# Patient Record
Sex: Female | Born: 1975 | Race: Black or African American | Hispanic: No | Marital: Married | Smoking: Never smoker
Health system: Southern US, Community
[De-identification: ages and names within clinical notes are randomized; demographics above are authoritative.]

## PROBLEM LIST (undated history)

## (undated) DIAGNOSIS — K449 Diaphragmatic hernia without obstruction or gangrene: Secondary | ICD-10-CM

## (undated) DIAGNOSIS — F419 Anxiety disorder, unspecified: Secondary | ICD-10-CM

## (undated) DIAGNOSIS — K469 Unspecified abdominal hernia without obstruction or gangrene: Secondary | ICD-10-CM

## (undated) DIAGNOSIS — Z8489 Family history of other specified conditions: Secondary | ICD-10-CM

## (undated) DIAGNOSIS — N3945 Continuous leakage: Secondary | ICD-10-CM

## (undated) DIAGNOSIS — I10 Essential (primary) hypertension: Secondary | ICD-10-CM

## (undated) DIAGNOSIS — K219 Gastro-esophageal reflux disease without esophagitis: Secondary | ICD-10-CM

## (undated) DIAGNOSIS — G43909 Migraine, unspecified, not intractable, without status migrainosus: Secondary | ICD-10-CM

## (undated) HISTORY — DX: Continuous leakage: N39.45

## (undated) HISTORY — DX: Anxiety disorder, unspecified: F41.9

## (undated) HISTORY — PX: TUBAL LIGATION: SHX77

## (undated) HISTORY — PX: WISDOM TOOTH EXTRACTION: SHX21

## (undated) HISTORY — DX: Diaphragmatic hernia without obstruction or gangrene: K44.9

## (undated) HISTORY — DX: Unspecified abdominal hernia without obstruction or gangrene: K46.9

## (undated) HISTORY — DX: Gastro-esophageal reflux disease without esophagitis: K21.9

## (undated) HISTORY — DX: Migraine, unspecified, not intractable, without status migrainosus: G43.909

---

## 2011-09-20 HISTORY — PX: COLONOSCOPY: SHX174

## 2012-04-11 ENCOUNTER — Encounter (INDEPENDENT_AMBULATORY_CARE_PROVIDER_SITE_OTHER): Payer: Self-pay | Admitting: *Deleted

## 2012-04-24 ENCOUNTER — Telehealth (INDEPENDENT_AMBULATORY_CARE_PROVIDER_SITE_OTHER): Payer: Self-pay | Admitting: *Deleted

## 2012-04-24 ENCOUNTER — Ambulatory Visit (INDEPENDENT_AMBULATORY_CARE_PROVIDER_SITE_OTHER): Payer: BC Managed Care – PPO | Admitting: Internal Medicine

## 2012-04-24 ENCOUNTER — Encounter (INDEPENDENT_AMBULATORY_CARE_PROVIDER_SITE_OTHER): Payer: Self-pay | Admitting: Internal Medicine

## 2012-04-24 ENCOUNTER — Other Ambulatory Visit (INDEPENDENT_AMBULATORY_CARE_PROVIDER_SITE_OTHER): Payer: Self-pay | Admitting: *Deleted

## 2012-04-24 VITALS — BP 100/80 | HR 68 | Temp 98.1°F | Ht 59.0 in | Wt 194.9 lb

## 2012-04-24 DIAGNOSIS — R195 Other fecal abnormalities: Secondary | ICD-10-CM

## 2012-04-24 DIAGNOSIS — Z800 Family history of malignant neoplasm of digestive organs: Secondary | ICD-10-CM

## 2012-04-24 DIAGNOSIS — R198 Other specified symptoms and signs involving the digestive system and abdomen: Secondary | ICD-10-CM

## 2012-04-24 DIAGNOSIS — Z1211 Encounter for screening for malignant neoplasm of colon: Secondary | ICD-10-CM

## 2012-04-24 DIAGNOSIS — G43909 Migraine, unspecified, not intractable, without status migrainosus: Secondary | ICD-10-CM | POA: Insufficient documentation

## 2012-04-24 DIAGNOSIS — K219 Gastro-esophageal reflux disease without esophagitis: Secondary | ICD-10-CM

## 2012-04-24 DIAGNOSIS — R1310 Dysphagia, unspecified: Secondary | ICD-10-CM

## 2012-04-24 MED ORDER — HYOSCYAMINE SULFATE 0.125 MG SL SUBL: 0.1250 mg | SUBLINGUAL_TABLET | SUBLINGUAL | Status: DC | PRN

## 2012-04-24 NOTE — Patient Instructions (Addendum)
EGD/ED and colonoscopy with Dr Karilyn Cota. Levsin 0.125mg  # 60 with 2 refills. GERD diet.

## 2012-04-24 NOTE — Telephone Encounter (Signed)
Patient needs movi prep 

## 2012-04-24 NOTE — Progress Notes (Signed)
Subjective:     Patient ID: Sabrina Hunter, female   DOB: June 30, 1976, 36 y.o.   MRN: [id number]  HPI Referral from Dr. Loney Hering for dysphagia and family hx of diverticulitis.  She tells me she has had trouble swallowing for about 3 months. Foods ar lodging in her esophagus.  Breads and french fries will lodge in her esophagus.  Spaghetti sauce and ketchup burn her esophagus.Corn upset her stomach.   She will have epigastric pain daily. The Prilosec is not helping. She also tells me she has rectal pain. It hurts to sit down. She has to sit on one buttock to prevent the pain. She is also passing a clear mucous about once every other day. She tells me some of her stools are hard and some are soft. Stools are sometimes green and sometimes brown.  She says she can feel a lump in her rectum when she wipes. She is having 4 stools a day which is abnormal for her . She use to have 1-2 stools a day. No melena or bright red rectal bleeding. Appetite is good. No weight loss.    Grandfather had colon cancer and deceased in his 56s.  Review of Systems see hpi Current Outpatient Prescriptions  Medication Sig Dispense Refill  . meloxicam (MOBIC) 7.5 MG tablet Take 7.5 mg by mouth daily.      Marland Kitchen omeprazole (PRILOSEC) 20 MG capsule Take 20 mg by mouth daily.      Marland Kitchen topiramate (TOPAMAX) 25 MG tablet Take 25 mg by mouth 2 (two) times daily.       Past Medical History  Diagnosis Date  . Migraines    Past Surgical History  Procedure Date  . Tubal ligation     2007   Family Status  Relation Status Death Age  . Mother Alive     HTN, DM2, high cholesterol  . Father Alive     HTN, diverticulitis, stomach problems.  . Sister Alive     Asthma, HTN   History   Social History  . Marital Status: Married    Spouse Name: N/A    Number of Children: N/A  . Years of Education: N/A   Occupational History  . Not on file.   Social History Main Topics  . Smoking status: Never Smoker   . Smokeless tobacco:  Not on file  . Alcohol Use: No  . Drug Use: No  . Sexually Active: Not on file   Other Topics Concern  . Not on file   Social History Narrative  . No narrative on file   No Known Allergies      Objective:   Physical Exam Filed Vitals:   04/24/12 0935  Height: 4\' 11"  (1.499 m)  Weight: 194 lb 14.4 oz (88.406 kg)    Alert and oriented. Skin warm and dry. Oral mucosa is moist.   . Sclera anicteric, conjunctivae is pink. Thyroid not enlarged. No cervical lymphadenopathy. Lungs clear. Heart regular rate and rhythm.  Abdomen is soft. Bowel sounds are positive. No hepatomegaly. No abdominal masses felt. No tenderness. Stool brown and guaiac negative.   No edema to lower extremities.      Assessment:    Dysphagia to solids. GERD not controlled with Prilosec. Change in bowel habits.      Plan:    EGD/ED, colonoscopy. GERD diet given to patient. Levsin 0.125mg  #60 with 2 refills. Samples of Aciphex 5 boxes given to patient

## 2012-04-25 MED ORDER — PEG-KCL-NACL-NASULF-NA ASC-C 100 G PO SOLR: 1.0000 | Freq: Once | ORAL | Status: DC

## 2012-05-16 ENCOUNTER — Encounter (INDEPENDENT_AMBULATORY_CARE_PROVIDER_SITE_OTHER): Payer: Self-pay

## 2012-05-16 MED ORDER — SODIUM CHLORIDE 0.45 % IV SOLN
Freq: Once | INTRAVENOUS | Status: AC
  Administered 2012-05-17: 1000 mL via INTRAVENOUS

## 2012-05-17 ENCOUNTER — Encounter (HOSPITAL_COMMUNITY)
Admission: RE | Disposition: A | Discharge status: Home / Self Care | Payer: Self-pay | Source: Ambulatory Visit | Attending: Internal Medicine

## 2012-05-17 ENCOUNTER — Ambulatory Visit (HOSPITAL_COMMUNITY)
Admission: RE | Admit: 2012-05-17 | Discharge: 2012-05-17 | Disposition: A | Discharge status: Home / Self Care | Payer: BC Managed Care – PPO | Source: Ambulatory Visit | Attending: Internal Medicine | Admitting: Internal Medicine

## 2012-05-17 ENCOUNTER — Encounter (HOSPITAL_COMMUNITY): Payer: Self-pay | Admitting: *Deleted

## 2012-05-17 DIAGNOSIS — R198 Other specified symptoms and signs involving the digestive system and abdomen: Secondary | ICD-10-CM | POA: Insufficient documentation

## 2012-05-17 DIAGNOSIS — Z8371 Family history of colonic polyps: Secondary | ICD-10-CM | POA: Insufficient documentation

## 2012-05-17 DIAGNOSIS — R195 Other fecal abnormalities: Secondary | ICD-10-CM

## 2012-05-17 DIAGNOSIS — K219 Gastro-esophageal reflux disease without esophagitis: Secondary | ICD-10-CM

## 2012-05-17 DIAGNOSIS — K644 Residual hemorrhoidal skin tags: Secondary | ICD-10-CM

## 2012-05-17 DIAGNOSIS — R1310 Dysphagia, unspecified: Secondary | ICD-10-CM

## 2012-05-17 DIAGNOSIS — Z800 Family history of malignant neoplasm of digestive organs: Secondary | ICD-10-CM

## 2012-05-17 DIAGNOSIS — K449 Diaphragmatic hernia without obstruction or gangrene: Secondary | ICD-10-CM

## 2012-05-17 DIAGNOSIS — Z83719 Family history of colon polyps, unspecified: Secondary | ICD-10-CM | POA: Insufficient documentation

## 2012-05-17 HISTORY — PX: BALLOON DILATION: SHX5330

## 2012-05-17 HISTORY — PX: MALONEY DILATION: SHX5535

## 2012-05-17 HISTORY — PX: SAVORY DILATION: SHX5439

## 2012-05-17 SURGERY — COLONOSCOPY WITH ESOPHAGOGASTRODUODENOSCOPY (EGD)
Anesthesia: Moderate Sedation

## 2012-05-17 MED ORDER — MEPERIDINE HCL 50 MG/ML IJ SOLN
INTRAMUSCULAR | Status: AC
  Filled 2012-05-17: qty 1

## 2012-05-17 MED ORDER — MEPERIDINE HCL 50 MG/ML IJ SOLN
INTRAMUSCULAR | Status: DC | PRN
  Administered 2012-05-17 (×2): 25 mg via INTRAVENOUS

## 2012-05-17 MED ORDER — MIDAZOLAM HCL 5 MG/5ML IJ SOLN
INTRAMUSCULAR | Status: AC
  Filled 2012-05-17: qty 10

## 2012-05-17 MED ORDER — STERILE WATER FOR IRRIGATION IR SOLN
Status: DC | PRN
  Administered 2012-05-17: 14:00:00

## 2012-05-17 MED ORDER — BUTAMBEN-TETRACAINE-BENZOCAINE 2-2-14 % EX AERO
INHALATION_SPRAY | CUTANEOUS | Status: DC | PRN
  Administered 2012-05-17: 2 via TOPICAL

## 2012-05-17 MED ORDER — MIDAZOLAM HCL 5 MG/5ML IJ SOLN
INTRAMUSCULAR | Status: DC | PRN
  Administered 2012-05-17 (×2): 2 mg via INTRAVENOUS
  Administered 2012-05-17: 1 mg via INTRAVENOUS
  Administered 2012-05-17: 2 mg via INTRAVENOUS
  Administered 2012-05-17: 1 mg via INTRAVENOUS
  Administered 2012-05-17: 2 mg via INTRAVENOUS

## 2012-05-17 NOTE — H&P (Signed)
Sabrina Hunter is an 36 y.o. female.   Chief Complaint: Patient is here for EGD, ED and colonoscopy. HPI: Patient is 5-year-old [place] female who presents with intermittent solid food dysphagia of few months duration. She points to her suprasternal area side abortus obstruction. She has no difficulty but liquids. She's had symptoms of heartburn for 2 years reasonably well controlled with medication and dietary changes. She also complains of rectal pain and she is trying to have a bowel movement. She has noted irregular bowel movements over the last 2 years. She goes from having had 2 loose stools. She denies melena rectal bleeding abdominal pain anorexia weight loss. She is very concerned about these symptoms because of family history of colonic polyps(father and 2 maternal aunts) and colon carcinoma in maternal grandfather.  Past Medical History  Diagnosis Date  . Migraines     Past Surgical History  Procedure Date  . Tubal ligation     2007    History reviewed. No pertinent family history. Social History:  reports that she has never smoked. She does not have any smokeless tobacco history on file. She reports that she does not drink alcohol or use illicit drugs.  Allergies: No Known Allergies  Medications Prior to Admission  Medication Sig Dispense Refill  . hyoscyamine (LEVSIN, ANASPAZ) 0.125 MG tablet Take 0.125 mg by mouth daily.      Marland Kitchen omeprazole (PRILOSEC) 40 MG capsule Take 40 mg by mouth daily.      . peg 3350 powder (MOVIPREP) 100 G SOLR Take 1 kit (100 g total) by mouth once.  1 kit  0  . topiramate (TOPAMAX) 25 MG tablet Take 25 mg by mouth daily.       . hyoscyamine (LEVSIN SL) 0.125 MG SL tablet Place 1 tablet (0.125 mg total) under the tongue every 4 (four) hours as needed for cramping.  60 tablet  2    No results found for this or any previous visit (from the past 48 hour(s)). No results found.  ROS  Blood pressure 140/75, pulse 85, temperature 97.9 F (36.6 C),  temperature source Oral, resp. rate 18, height 4\' 11"  (1.499 m), weight 192 lb (87.091 kg), last menstrual period 05/17/2012, SpO2 95.00%. Physical Exam  Constitutional: She appears well-developed and well-nourished.  HENT:  Mouth/Throat: Oropharynx is clear and moist.  Eyes: Conjunctivae are normal. No scleral icterus.  Neck: No thyromegaly present.  Cardiovascular: Normal rate, regular rhythm and normal heart sounds.   No murmur heard. Respiratory: Effort normal and breath sounds normal.  GI: Soft. She exhibits no distension and no mass. There is no tenderness.  Musculoskeletal: She exhibits no edema.  Lymphadenopathy:    She has no cervical adenopathy.  Neurological: She is alert.  Skin: Skin is warm and dry.     Assessment/Plan Chronic GERD. Dysphagia, change in bowel habits and rectal pain. Family history of colonic polyps and CRC in second-degree relative. EGD, ED and colonoscopy.  Omero Kowal U 05/17/2012, 1:52 PM

## 2012-05-17 NOTE — Op Note (Signed)
EGD PROCEDURE REPORT  PATIENT:  Sabrina Hunter  MR#:  [id number] Birthdate:  1976-02-07, 36 y.o., female Endoscopist:  Dr. Malissa Hippo, MD Referred By:  Dr. Ernestine Conrad, MD Procedure Date: 05/17/2012  Procedure:   EGD, ED & Colonoscopy.  Indications:  Patient is 36 year old African female with chronic GERD who presents with intermittent solid food dysphagia. She is also undergoing diagnostic colonoscopy because of change in her bowel habits. Family history significant for colonic polyps in her father 2 maternal aunts and maternal father had colon cancer.            Informed Consent:  The risks, benefits, alternatives & imponderables which include, but are not limited to, bleeding, infection, perforation, drug reaction and potential missed lesion have been reviewed.  The potential for biopsy, lesion removal, esophageal dilation, etc. have also been discussed.  Questions have been answered.  All parties agreeable.  Please see history & physical in medical record for more information.  Medications:  Demerol 50 mg IV Versed 10 mg IV Cetacaine spray topically for oropharyngeal anesthesia  EGD  Description of procedure:  The endoscope was introduced through the mouth and advanced to the second portion of the duodenum without difficulty or limitations. The mucosal surfaces were surveyed very carefully during advancement of the scope and upon withdrawal.  Findings:  Esophagus:  Mucosa of the esophagus was normal. GE junction was unremarkable. GEJ:  34 cm Hiatus:  36 cm Stomach:  Stomach was empty and distended very well with insufflation. Folds in the proximal stomach were normal. Examination of mucosa at body, antrum, pyloric channel, angularis, fundus and cardia was normal. Duodenum:  Normal bulbar and post bulbar mucosa.  Therapeutic/Diagnostic Maneuvers Performed:  Esophagus was dilated by passing 54 [place] Maloney dilator. Esophageal mucosa was reexamined post dilation and no mucosal  disruption noted.  COLONOSCOPY Description of procedure:  After a digital rectal exam was performed, that colonoscope was advanced from the anus through the rectum and colon to the area of the cecum, ileocecal valve and appendiceal orifice. The cecum was deeply intubated. These structures were well-seen and photographed for the record. From the level of the cecum and ileocecal valve, the scope was slowly and cautiously withdrawn. The mucosal surfaces were carefully surveyed utilizing scope tip to flexion to facilitate fold flattening as needed. The scope was pulled down into the rectum where a thorough exam including retroflexion was performed.  Findings:   Prep excellent. Normal mucosa of the colon throughout. Normal rectal mucosa. Small hemorrhoids below the dentate line.  Therapeutic/Diagnostic Maneuvers Performed:  None  Complications:  None  Cecal Withdrawal Time:  7 minutes  Impression:  Small sliding hiatal hernia otherwise normal EGD. Esophagus dilated by passing 54 [place] Maloney dilator but no mucosal disruption noted. Normal colonoscopy except small external hemorrhoids.  Recommendations:  Standard instructions given. High fiber diet plus fiber supplement 3-4 g by mouth daily. Align or equivalent 1 capsule by mouth daily. Progress report in few weeks.  REHMAN,NAJEEB U  05/17/2012 2:36 PM  CC: Dr. Ernestine Conrad, MD & Dr. Bonnetta Barry ref. provider found

## 2012-05-25 ENCOUNTER — Encounter (HOSPITAL_COMMUNITY): Payer: Self-pay | Admitting: Internal Medicine

## 2012-11-14 ENCOUNTER — Ambulatory Visit: Payer: BC Managed Care – PPO | Admitting: Obstetrics and Gynecology

## 2012-11-14 ENCOUNTER — Encounter: Payer: Self-pay | Admitting: Obstetrics and Gynecology

## 2012-11-14 VITALS — BP 118/78 | Ht 60.0 in | Wt 205.0 lb

## 2012-11-14 DIAGNOSIS — K589 Irritable bowel syndrome without diarrhea: Secondary | ICD-10-CM | POA: Insufficient documentation

## 2012-11-14 DIAGNOSIS — N898 Other specified noninflammatory disorders of vagina: Secondary | ICD-10-CM

## 2012-11-14 DIAGNOSIS — N819 Female genital prolapse, unspecified: Secondary | ICD-10-CM

## 2012-11-14 DIAGNOSIS — B9689 Other specified bacterial agents as the cause of diseases classified elsewhere: Secondary | ICD-10-CM

## 2012-11-14 DIAGNOSIS — Z6841 Body Mass Index (BMI) 40.0 and over, adult: Secondary | ICD-10-CM | POA: Insufficient documentation

## 2012-11-14 DIAGNOSIS — R102 Pelvic and perineal pain: Secondary | ICD-10-CM

## 2012-11-14 LAB — POCT WET PREP (WET MOUNT)
Trichomonas Wet Prep HPF POC: NEGATIVE
pH: 4.5

## 2012-11-14 LAB — POCT URINALYSIS DIPSTICK
Glucose, UA: NEGATIVE
Leukocytes, UA: NEGATIVE
Spec Grav, UA: 1.025
Urobilinogen, UA: NEGATIVE

## 2012-11-14 MED ORDER — TINIDAZOLE 500 MG PO TABS: 2.0000 g | ORAL_TABLET | Freq: Once | ORAL | Status: AC

## 2012-11-14 NOTE — Addendum Note (Signed)
Addended by: Darien Ramus on: 11/14/2012 02:51 PM   Modules accepted: Orders

## 2012-11-14 NOTE — Progress Notes (Signed)
The patient reports:has ?prolapsed uterus, urine leakage with vaginal discharge and odor. Pt also states pain with intercourse   Contraception BTL  Last Mammogram:per pt 2013 Normal Last Pap:per pt 09/2011 Normal  GC/Chlamydia cultures offered: declined HIV/RPR/HbsAg offered:  declined HSV 1 and 2 glycoprotein offered: declined  Menstrual cycle irregular since she started her cycles. Pt skipped October and then has been regular but has not had a cycle this month Menstrual flow : cycle is heavier now lasting 7- 10 days Pt states has many clots size of a quarter and changes pad every hour and a half when heavy.  Urinary symptoms: urinary incontinence Normal bowel movements: Yes Reports abuse at home: No:   Subjective:    Sabrina Hunter is a 37 y.o. female, [id number], who presents for abdominal / pelvic pain and vaginal discharge with odor.  Pain: stabbing pain in vaginal area when walking and sitting. Sharp rapid pain that last 10-15 minutes. Having Pain with intercourse x 6 months. Pain scale 7/10. Having sharp stabbing pain on left abdomin as well, no triggers, lasting 1 hour. Pain scale 6/10, with Advil brings pain to 2/10. Also states she has stabbing pain in rectum as well. Had colonoscopy last year and was advised that she had hernia. Constipation. But states that she feels a vaginal bulge.     History   Social History  . Marital Status: Married    Spouse Name: N/A    Number of Children: N/A  . Years of Education: N/A   Social History Main Topics  . Smoking status: Never Smoker   . Smokeless tobacco: Never Used  . Alcohol Use: No  . Drug Use: No  . Sexually Active: Yes    Birth Control/ Protection: Surgical     Comment: BTL   Other Topics Concern  . Not on file   Social History Narrative  . No narrative on file    Menstrual cycle:   LMP: Patient's last menstrual period was 10/12/2012.           Cycle: see HPI  The following portions of the patient's history were  reviewed and updated as appropriate: allergies, current medications, past family history, past medical history, past social history, past surgical history and problem list.  Review of Systems Pertinent items are noted in HPI. Breast:Negative for breast lump,nipple discharge or nipple retraction Gastrointestinal: Negative for abdominal pain, change in bowel habits or rectal bleeding Urinary:negative   Objective:    BP 118/78  Ht 5' (1.524 m)  Wt 205 lb (92.987 kg)  BMI 40.04 kg/m2  LMP 10/12/2012    Weight:  Wt Readings from Last 1 Encounters:  11/14/12 205 lb (92.987 kg)          BMI: Body mass index is 40.04 kg/(m^2).  General Appearance: Alert, appropriate appearance for age. No acute distress HEENT: Grossly normal Neck / Thyroid: Supple, no masses, nodes or enlargement Lungs: clear to auscultation bilaterally Back: No CVA tenderness Breast Exam: No masses or nodes.No dimpling, nipple retraction or discharge. Cardiovascular: Regular rate and rhythm. S1, S2, no murmur Gastrointestinal: Soft, non-tender, no masses or organomegaly Pelvic Exam: Vulva normal. Vagina with cystocele3/4, rectocele 1/4 and uterine prolapse 2-3/4 Uterus normal size and NT. Adnexa not felt due to body habitus Rectovaginal: normal Lymphatic Exam: Non-palpable nodes in neck, clavicular, axillary, or inguinal regions Skin: no rash or abnormalities Neurologic: Normal gait and speech, no tremor  Psychiatric: Alert and oriented, appropriate affect.   Osom BV: + Osom Trich: -  Assessment:    Pelvic Prolapse  in patient who desires surgical correction Unexplained pelvic pain / dyspareunia    Plan:     pap smear done STD screening: declined Contraception:bilateral tubal ligation  Physical Therapy for Pelvic Prolapse discussed Surgery for Pelvic Prolapse Risk & Benefits Reviewed including risk of recurrence and risks associated with mesh Bladder Mesh discussed  U/S of uterus and ovaries for pelvic  pain next available  Lumax and apt with AR to discuss TVT   45 minutes face-to-face with pt and husband   Silverio Lay MD

## 2012-11-15 LAB — PAP IG W/ RFLX HPV ASCU

## 2013-03-11 ENCOUNTER — Other Ambulatory Visit (HOSPITAL_COMMUNITY): Payer: Self-pay | Admitting: Obstetrics and Gynecology

## 2013-03-11 NOTE — H&P (Signed)
Sabrina Hunter is a 37 y.o.  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 For the past year the patient's menstrual pattern has been monthly with clots for 7-10 days requiring her to change her pad every 1.5 hours.  She has gone as many as 2 months without a period but no such occurrence recently.  She goes on to report stabbing intermittent pains in her pelvic area that are not brought on by anything she can identify.  Those pains, however,  as well as her menstrual cramps are relieved with Advil.  She admits  to dyspareunia, constipation and occasional lower back pain.  For the past six months the patient has also noticed that with extensive walking, sneezing or coughing she will leak urine and has to wear a Poise Pad.  At times she'll have urinary hesitancy, urgency, lots of pressure when she has to void but will only void a scant amount.   Nooctura x 3 has also become a daily event.  Consequently the patient underwent  urodynamic studies (Lumax) that confirmed mixed urinary incontinence.  On physical exam the patient was found to have pelvic prolapse that extended 2/3 the length of the vagina. . A pelvic ultrasound in May showed a uterus-7.77 x 7.23 x 7.13 with a single  fibroid-1.99 x 1.58 x 1,87 cm (posterior intramural). Left ovary was 3.74 x 2.22 x 2.49 cm and right ovary 3.01 x 1.93 x 2.01 cm.  After a review of both medical and surgical management options for incontinence, pelvic prolapse and dysfunctional uterine bleeding, the patient decided to proceed with surgical management.  Past Medical History  OB History: G4; P 2-0-2-2;  SVD  2007 (5lbs 8 oz)  & 2001 (5lbs 5 oz)  GYN History: menarche: 37 YO   LMP: Oligomenorrhea    Contracepton bilateral tubal ligation  The patient denies history of sexually transmitted disease.  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   ROS: Admits to glasses and braces on teeth (upper & lower), anterior thigh achiness  but   denies headache/vision changes (except with migraines), nasal congestion, dysphagia, tinnitus, dizziness, hoarseness, cough,  chest pain, shortness of breath, nausea, vomiting, diarrhea,constipation,  urinary frequency, urgency  dysuria, hematuria, vaginitis symptoms, pelvic pain, swelling of joints,easy bruising, arthralgias, skin rashes, unexplained weight loss and except as is mentioned in the history of present illness, patient's review of systems is otherwise negative.  Physical Exam  Bp: 106/80;   P: 85 ;    T 98.2 degrees F orally;    Weight: 206lbs           Height: 4'11"         BM: 41.6  Neck: supple without masses or thyromegaly Lungs: clear to auscultation Heart: regular rate and rhythm Abdomen: soft, non-tender and no organomegaly Pelvic:EGBUS- wnl; vagina-with 3/4 cystocele and  1/4 rectocele; uterus-normal size and prolapsed to 2-3/4;  cervix without lesions or motion tenderness; adnexae-no tenderness or masses (exam limited by habitus) Extremities:  no clubbing, cyanosis or edema   Assesment:  Pelvic Prolapse            Dysfunctional Uterine Bleeding                       Mixed Urinary Incontinence   Disposition:  A discussion was held with patient regarding the indication for her procedure(s)  along with the risks, which include but are not limited to: reaction to anesthesia, damage to adjacent organs, infection, excessive bleeding, pelvic prolapse, possible need for an open abdominal incision, erosion of tension free vaginal tape and worsening of bladder symptoms.  The patient verbalized understanding of these risks and has consented to proceed with a Total Vaginal Hysterectomy, Anterior-Posterior Repair, Placement of Tension Free Vaginal Tape, Possible Salpingectomy and Cystoscopy at [facility] on April 02, 2013.   CSN# [id number]   Raymond Bhardwaj J. Lowell Guitar, PA-C  for Dr. Crist Fat.Rivard

## 2013-03-12 ENCOUNTER — Other Ambulatory Visit: Payer: Self-pay | Admitting: Obstetrics and Gynecology

## 2013-03-18 ENCOUNTER — Encounter (HOSPITAL_COMMUNITY): Payer: Self-pay

## 2013-03-26 ENCOUNTER — Encounter (HOSPITAL_COMMUNITY)
Admission: RE | Admit: 2013-03-26 | Discharge: 2013-03-26 | Disposition: A | Discharge status: Home / Self Care | Payer: BC Managed Care – PPO | Source: Ambulatory Visit | Attending: Obstetrics and Gynecology | Admitting: Obstetrics and Gynecology

## 2013-03-26 ENCOUNTER — Encounter (HOSPITAL_COMMUNITY): Payer: Self-pay

## 2013-03-26 DIAGNOSIS — Z01812 Encounter for preprocedural laboratory examination: Secondary | ICD-10-CM | POA: Insufficient documentation

## 2013-03-26 DIAGNOSIS — Z01818 Encounter for other preprocedural examination: Secondary | ICD-10-CM | POA: Insufficient documentation

## 2013-03-26 HISTORY — DX: Family history of other specified conditions: Z84.89

## 2013-03-26 LAB — CBC
MCH: 28.1 pg (ref 26.0–34.0)
MCHC: 33.9 g/dL (ref 30.0–36.0)
MCV: 82.9 fL (ref 78.0–100.0)
Platelets: 243 10*3/uL (ref 150–400)

## 2013-03-26 LAB — BASIC METABOLIC PANEL
BUN: 8 mg/dL (ref 6–23)
CO2: 25 mEq/L (ref 19–32)
Calcium: 9.4 mg/dL (ref 8.4–10.5)
Creatinine, Ser: 0.62 mg/dL (ref 0.50–1.10)
GFR calc non Af Amer: >90 mL/min (ref >90)
Glucose, Bld: 86 mg/dL (ref 70–99)

## 2013-03-26 NOTE — Patient Instructions (Addendum)
   Your procedure is scheduled on: Tuesday, July 15th Enter through the Main Entrance of [facility] at:  8:30 am  Pick up the phone at the desk and dial [phone] and inform us of your arrival.  Please call this number if you have any problems the morning of surgery: [phone]  Remember: Do not drink any liquids or eat any solid foods after midnight on: Tuesday  Please take these medications, with sips of water, on the morning of surgery: Zegerid, and Topomax (only if needed for migraine)  Do not wear jewelry, make-up, or FINGER nail polish No metal in your hair or on your body. Do not wear lotions, powders, perfumes. You may wear deodorant.  Please use your CHG wash as directed prior to surgery.  Do not shave anywhere for at least 12 hours prior to first CHG shower.  Do not bring valuables to the hospital.   Leave suitcase in the car. After Surgery it may be brought to your room.  For patients being admitted to the hospital, checkout time is 11:00am the day of discharge.

## 2013-04-01 MED ORDER — DEXTROSE 5 % IV SOLN
2.0000 g | INTRAVENOUS | Status: AC
  Administered 2013-04-02: 2 g via INTRAVENOUS
  Filled 2013-04-01: qty 2

## 2013-04-02 ENCOUNTER — Ambulatory Visit (HOSPITAL_COMMUNITY): Payer: BC Managed Care – PPO | Admitting: Anesthesiology

## 2013-04-02 ENCOUNTER — Observation Stay (HOSPITAL_COMMUNITY)
Admission: RE | Admit: 2013-04-02 | Discharge: 2013-04-04 | Disposition: A | Discharge status: Home / Self Care | Payer: BC Managed Care – PPO | Source: Ambulatory Visit | Attending: Obstetrics and Gynecology | Admitting: Obstetrics and Gynecology

## 2013-04-02 ENCOUNTER — Encounter (HOSPITAL_COMMUNITY): Payer: Self-pay | Admitting: Anesthesiology

## 2013-04-02 ENCOUNTER — Encounter (HOSPITAL_COMMUNITY): Payer: Self-pay | Admitting: *Deleted

## 2013-04-02 ENCOUNTER — Encounter (HOSPITAL_COMMUNITY)
Admission: RE | Disposition: A | Discharge status: Home / Self Care | Payer: Self-pay | Source: Ambulatory Visit | Attending: Obstetrics and Gynecology

## 2013-04-02 DIAGNOSIS — R112 Nausea with vomiting, unspecified: Secondary | ICD-10-CM | POA: Insufficient documentation

## 2013-04-02 DIAGNOSIS — N8000 Endometriosis of the uterus, unspecified: Secondary | ICD-10-CM | POA: Insufficient documentation

## 2013-04-02 DIAGNOSIS — R339 Retention of urine, unspecified: Secondary | ICD-10-CM | POA: Insufficient documentation

## 2013-04-02 DIAGNOSIS — N3946 Mixed incontinence: Secondary | ICD-10-CM | POA: Insufficient documentation

## 2013-04-02 DIAGNOSIS — N949 Unspecified condition associated with female genital organs and menstrual cycle: Secondary | ICD-10-CM | POA: Insufficient documentation

## 2013-04-02 DIAGNOSIS — N938 Other specified abnormal uterine and vaginal bleeding: Secondary | ICD-10-CM | POA: Insufficient documentation

## 2013-04-02 DIAGNOSIS — IMO0002 Reserved for concepts with insufficient information to code with codable children: Secondary | ICD-10-CM | POA: Insufficient documentation

## 2013-04-02 DIAGNOSIS — N72 Inflammatory disease of cervix uteri: Secondary | ICD-10-CM | POA: Insufficient documentation

## 2013-04-02 DIAGNOSIS — N812 Incomplete uterovaginal prolapse: Principal | ICD-10-CM | POA: Insufficient documentation

## 2013-04-02 DIAGNOSIS — D251 Intramural leiomyoma of uterus: Secondary | ICD-10-CM | POA: Insufficient documentation

## 2013-04-02 HISTORY — PX: ANTERIOR AND POSTERIOR REPAIR: SHX5121

## 2013-04-02 HISTORY — PX: VAGINAL HYSTERECTOMY: SHX2639

## 2013-04-02 HISTORY — PX: BLADDER SUSPENSION: SHX72

## 2013-04-02 SURGERY — HYSTERECTOMY, VAGINAL
Anesthesia: General

## 2013-04-02 MED ORDER — FENTANYL CITRATE 0.05 MG/ML IJ SOLN
INTRAMUSCULAR | Status: AC
  Filled 2013-04-02: qty 5

## 2013-04-02 MED ORDER — KETOROLAC TROMETHAMINE 30 MG/ML IJ SOLN
30.0000 mg | Freq: Four times a day (QID) | INTRAMUSCULAR | Status: AC
  Administered 2013-04-03 (×2): 30 mg via INTRAVENOUS
  Filled 2013-04-02 (×2): qty 1

## 2013-04-02 MED ORDER — PHENYLEPHRINE 40 MCG/ML (10ML) SYRINGE FOR IV PUSH (FOR BLOOD PRESSURE SUPPORT)
PREFILLED_SYRINGE | INTRAVENOUS | Status: AC
  Filled 2013-04-02: qty 5

## 2013-04-02 MED ORDER — PROPOFOL 10 MG/ML IV EMUL
INTRAVENOUS | Status: AC
  Filled 2013-04-02: qty 20

## 2013-04-02 MED ORDER — NALOXONE HCL 0.4 MG/ML IJ SOLN: 0.4000 mg | INTRAMUSCULAR | Status: DC | PRN

## 2013-04-02 MED ORDER — ONDANSETRON HCL 4 MG/2ML IJ SOLN
4.0000 mg | Freq: Four times a day (QID) | INTRAMUSCULAR | Status: DC | PRN
  Administered 2013-04-03: 4 mg via INTRAVENOUS

## 2013-04-02 MED ORDER — GLYCOPYRROLATE 0.2 MG/ML IJ SOLN
INTRAMUSCULAR | Status: DC | PRN
  Administered 2013-04-02: 0.6 mg via INTRAVENOUS

## 2013-04-02 MED ORDER — ONDANSETRON HCL 4 MG/2ML IJ SOLN
INTRAMUSCULAR | Status: AC
  Filled 2013-04-02: qty 2

## 2013-04-02 MED ORDER — DEXTROSE-NACL 5-0.45 % IV SOLN
INTRAVENOUS | Status: DC
  Administered 2013-04-02 – 2013-04-03 (×3): via INTRAVENOUS

## 2013-04-02 MED ORDER — TEMAZEPAM 15 MG PO CAPS: 15.0000 mg | ORAL_CAPSULE | Freq: Every evening | ORAL | Status: DC | PRN

## 2013-04-02 MED ORDER — HYDROMORPHONE HCL PF 1 MG/ML IJ SOLN
INTRAMUSCULAR | Status: AC
  Filled 2013-04-02: qty 1

## 2013-04-02 MED ORDER — ROCURONIUM BROMIDE 50 MG/5ML IV SOLN
INTRAVENOUS | Status: AC
  Filled 2013-04-02: qty 1

## 2013-04-02 MED ORDER — HYDROMORPHONE 0.3 MG/ML IV SOLN
INTRAVENOUS | Status: DC
  Administered 2013-04-02: 20:00:00 via INTRAVENOUS
  Administered 2013-04-03: 2 mg via INTRAVENOUS
  Filled 2013-04-02: qty 25

## 2013-04-02 MED ORDER — ONDANSETRON HCL 4 MG/2ML IJ SOLN
4.0000 mg | Freq: Four times a day (QID) | INTRAMUSCULAR | Status: DC | PRN
  Administered 2013-04-02: 4 mg via INTRAVENOUS
  Filled 2013-04-02 (×2): qty 2

## 2013-04-02 MED ORDER — MIDAZOLAM HCL 2 MG/2ML IJ SOLN: 0.5000 mg | Freq: Once | INTRAMUSCULAR | Status: DC | PRN

## 2013-04-02 MED ORDER — LIDOCAINE HCL (CARDIAC) 20 MG/ML IV SOLN
INTRAVENOUS | Status: AC
  Filled 2013-04-02: qty 5

## 2013-04-02 MED ORDER — KETOROLAC TROMETHAMINE 30 MG/ML IJ SOLN: 15.0000 mg | Freq: Once | INTRAMUSCULAR | Status: DC | PRN

## 2013-04-02 MED ORDER — LIDOCAINE-EPINEPHRINE (PF) 1 %-1:200000 IJ SOLN
INTRAMUSCULAR | Status: DC | PRN
  Administered 2013-04-02: 10 mL
  Administered 2013-04-02: 8 mL
  Administered 2013-04-02: 16 mL

## 2013-04-02 MED ORDER — KETOROLAC TROMETHAMINE 30 MG/ML IJ SOLN
INTRAMUSCULAR | Status: DC | PRN
  Administered 2013-04-02: 30 mg via INTRAVENOUS

## 2013-04-02 MED ORDER — HYDROMORPHONE HCL PF 1 MG/ML IJ SOLN
INTRAMUSCULAR | Status: DC | PRN
  Administered 2013-04-02: 0.5 mg via INTRAVENOUS
  Administered 2013-04-02: 1 mg via INTRAVENOUS
  Administered 2013-04-02: 0.5 mg via INTRAVENOUS

## 2013-04-02 MED ORDER — KETOROLAC TROMETHAMINE 30 MG/ML IJ SOLN
INTRAMUSCULAR | Status: AC
  Filled 2013-04-02: qty 1

## 2013-04-02 MED ORDER — NEOSTIGMINE METHYLSULFATE 1 MG/ML IJ SOLN
INTRAMUSCULAR | Status: DC | PRN
  Administered 2013-04-02: 3 mg via INTRAVENOUS

## 2013-04-02 MED ORDER — MEPERIDINE HCL 25 MG/ML IJ SOLN: 6.2500 mg | INTRAMUSCULAR | Status: DC | PRN

## 2013-04-02 MED ORDER — OXYCODONE-ACETAMINOPHEN 5-325 MG PO TABS
1.0000 | ORAL_TABLET | ORAL | Status: DC | PRN
  Administered 2013-04-02 (×2): 2 via ORAL
  Administered 2013-04-03: 1 via ORAL
  Administered 2013-04-03 (×2): 2 via ORAL
  Administered 2013-04-03: 1 via ORAL
  Administered 2013-04-04: 2 via ORAL
  Administered 2013-04-04: 1 via ORAL
  Filled 2013-04-02 (×4): qty 2
  Filled 2013-04-02: qty 1
  Filled 2013-04-02: qty 2
  Filled 2013-04-02 (×2): qty 1

## 2013-04-02 MED ORDER — LACTATED RINGERS IV SOLN
INTRAVENOUS | Status: DC
  Administered 2013-04-02 (×2): via INTRAVENOUS

## 2013-04-02 MED ORDER — PANTOPRAZOLE SODIUM 40 MG PO TBEC
40.0000 mg | DELAYED_RELEASE_TABLET | Freq: Every day | ORAL | Status: DC
  Administered 2013-04-03 – 2013-04-04 (×2): 40 mg via ORAL
  Filled 2013-04-02 (×2): qty 1

## 2013-04-02 MED ORDER — PROMETHAZINE HCL 25 MG/ML IJ SOLN
12.5000 mg | Freq: Four times a day (QID) | INTRAMUSCULAR | Status: DC | PRN
  Administered 2013-04-02: 12.5 mg via INTRAVENOUS
  Filled 2013-04-02: qty 1

## 2013-04-02 MED ORDER — INDIGOTINDISULFONATE SODIUM 8 MG/ML IJ SOLN
INTRAMUSCULAR | Status: DC | PRN
  Administered 2013-04-02: 5 mL via INTRAVENOUS

## 2013-04-02 MED ORDER — ESTRADIOL 0.1 MG/GM VA CREA
TOPICAL_CREAM | VAGINAL | Status: DC | PRN
  Administered 2013-04-02: 1 via VAGINAL

## 2013-04-02 MED ORDER — TOPIRAMATE 25 MG PO TABS
25.0000 mg | ORAL_TABLET | Freq: Every day | ORAL | Status: DC | PRN
  Filled 2013-04-02: qty 1

## 2013-04-02 MED ORDER — STERILE WATER FOR IRRIGATION IR SOLN
Status: DC | PRN
  Administered 2013-04-02: 400 mL

## 2013-04-02 MED ORDER — INDIGOTINDISULFONATE SODIUM 8 MG/ML IJ SOLN
INTRAMUSCULAR | Status: AC
  Filled 2013-04-02: qty 5

## 2013-04-02 MED ORDER — DEXAMETHASONE SODIUM PHOSPHATE 10 MG/ML IJ SOLN
INTRAMUSCULAR | Status: AC
  Filled 2013-04-02: qty 1

## 2013-04-02 MED ORDER — ROCURONIUM BROMIDE 100 MG/10ML IV SOLN
INTRAVENOUS | Status: DC | PRN
  Administered 2013-04-02 (×2): 10 mg via INTRAVENOUS
  Administered 2013-04-02: 40 mg via INTRAVENOUS

## 2013-04-02 MED ORDER — ONDANSETRON HCL 4 MG/2ML IJ SOLN
INTRAMUSCULAR | Status: DC | PRN
  Administered 2013-04-02: 4 mg via INTRAVENOUS

## 2013-04-02 MED ORDER — IBUPROFEN 600 MG PO TABS
600.0000 mg | ORAL_TABLET | Freq: Four times a day (QID) | ORAL | Status: DC | PRN
  Administered 2013-04-02 – 2013-04-04 (×4): 600 mg via ORAL
  Filled 2013-04-02 (×5): qty 1

## 2013-04-02 MED ORDER — ESTRADIOL 0.1 MG/GM VA CREA
TOPICAL_CREAM | VAGINAL | Status: AC
  Filled 2013-04-02: qty 42.5

## 2013-04-02 MED ORDER — LIDOCAINE-EPINEPHRINE (PF) 1 %-1:200000 IJ SOLN
INTRAMUSCULAR | Status: AC
  Filled 2013-04-02: qty 10

## 2013-04-02 MED ORDER — HYDROMORPHONE HCL PF 1 MG/ML IJ SOLN
0.2500 mg | INTRAMUSCULAR | Status: DC | PRN
  Administered 2013-04-02: 0.5 mg via INTRAVENOUS

## 2013-04-02 MED ORDER — MENTHOL 3 MG MT LOZG: 1.0000 | LOZENGE | OROMUCOSAL | Status: DC | PRN

## 2013-04-02 MED ORDER — HYDROMORPHONE HCL PF 1 MG/ML IJ SOLN
INTRAMUSCULAR | Status: AC
  Administered 2013-04-02: 0.5 mg via INTRAVENOUS
  Filled 2013-04-02: qty 1

## 2013-04-02 MED ORDER — 0.9 % SODIUM CHLORIDE (POUR BTL) OPTIME
TOPICAL | Status: DC | PRN
  Administered 2013-04-02: 1000 mL

## 2013-04-02 MED ORDER — DEXAMETHASONE SODIUM PHOSPHATE 10 MG/ML IJ SOLN
INTRAMUSCULAR | Status: DC | PRN
  Administered 2013-04-02: 10 mg via INTRAVENOUS

## 2013-04-02 MED ORDER — ONDANSETRON HCL 4 MG PO TABS: 4.0000 mg | ORAL_TABLET | Freq: Four times a day (QID) | ORAL | Status: DC | PRN

## 2013-04-02 MED ORDER — SODIUM CHLORIDE 0.9 % IJ SOLN: 9.0000 mL | INTRAMUSCULAR | Status: DC | PRN

## 2013-04-02 MED ORDER — FENTANYL CITRATE 0.05 MG/ML IJ SOLN
INTRAMUSCULAR | Status: DC | PRN
  Administered 2013-04-02 (×5): 50 ug via INTRAVENOUS

## 2013-04-02 MED ORDER — MIDAZOLAM HCL 5 MG/5ML IJ SOLN
INTRAMUSCULAR | Status: DC | PRN
  Administered 2013-04-02: 2 mg via INTRAVENOUS

## 2013-04-02 MED ORDER — PROMETHAZINE HCL 25 MG/ML IJ SOLN: 6.2500 mg | INTRAMUSCULAR | Status: DC | PRN

## 2013-04-02 MED ORDER — DIPHENHYDRAMINE HCL 12.5 MG/5ML PO ELIX: 12.5000 mg | ORAL_SOLUTION | Freq: Four times a day (QID) | ORAL | Status: DC | PRN

## 2013-04-02 MED ORDER — DIPHENHYDRAMINE HCL 50 MG/ML IJ SOLN: 12.5000 mg | Freq: Four times a day (QID) | INTRAMUSCULAR | Status: DC | PRN

## 2013-04-02 MED ORDER — PROPOFOL 10 MG/ML IV BOLUS
INTRAVENOUS | Status: DC | PRN
  Administered 2013-04-02: 170 mg via INTRAVENOUS

## 2013-04-02 MED ORDER — LIDOCAINE HCL (CARDIAC) 20 MG/ML IV SOLN
INTRAVENOUS | Status: DC | PRN
  Administered 2013-04-02: 30 mg via INTRAVENOUS
  Administered 2013-04-02: 70 mg via INTRAVENOUS

## 2013-04-02 MED ORDER — MIDAZOLAM HCL 2 MG/2ML IJ SOLN
INTRAMUSCULAR | Status: AC
  Filled 2013-04-02: qty 2

## 2013-04-02 MED ORDER — GLYCOPYRROLATE 0.2 MG/ML IJ SOLN
INTRAMUSCULAR | Status: AC
  Filled 2013-04-02: qty 3

## 2013-04-02 MED ORDER — NEOSTIGMINE METHYLSULFATE 1 MG/ML IJ SOLN
INTRAMUSCULAR | Status: AC
  Filled 2013-04-02: qty 1

## 2013-04-02 SURGICAL SUPPLY — 56 items
BLADE SURG 11 STRL SS (BLADE) ×2 IMPLANT
BLADE SURG 15 STRL LF C SS BP (BLADE) ×1 IMPLANT
BLADE SURG 15 STRL SS (BLADE) ×1
CANISTER SUCTION 2500CC (MISCELLANEOUS) ×4 IMPLANT
CATH FOLEY 2WAY SLVR  5CC 18FR (CATHETERS) ×2
CATH FOLEY 2WAY SLVR 30CC 16FR (CATHETERS) ×2 IMPLANT
CATH FOLEY 2WAY SLVR 5CC 18FR (CATHETERS) ×2 IMPLANT
CLOTH BEACON ORANGE TIMEOUT ST (SAFETY) ×4 IMPLANT
CONT PATH 16OZ SNAP LID 3702 (MISCELLANEOUS) IMPLANT
DECANTER SPIKE VIAL GLASS SM (MISCELLANEOUS) IMPLANT
DERMABOND ADVANCED (GAUZE/BANDAGES/DRESSINGS) ×1
DERMABOND ADVANCED .7 DNX12 (GAUZE/BANDAGES/DRESSINGS) ×1 IMPLANT
DRAPE HYSTEROSCOPY (DRAPE) ×2 IMPLANT
DRAPE PROXIMA HALF (DRAPES) ×6 IMPLANT
DRAPE STERI URO 9X17 APER PCH (DRAPES) ×2 IMPLANT
DRESSING TELFA 8X3 (GAUZE/BANDAGES/DRESSINGS) ×2 IMPLANT
DRSG COVADERM PLUS 2X2 (GAUZE/BANDAGES/DRESSINGS) IMPLANT
GAUZE PACKING 1 X5 YD ST (GAUZE/BANDAGES/DRESSINGS) ×2 IMPLANT
GAUZE PACKING 2X5 YD STERILE (GAUZE/BANDAGES/DRESSINGS) IMPLANT
GAUZE SPONGE 4X4 16PLY XRAY LF (GAUZE/BANDAGES/DRESSINGS) ×2 IMPLANT
GLOVE BIO SURGEON STRL SZ7.5 (GLOVE) ×2 IMPLANT
GLOVE BIOGEL PI IND STRL 7.0 (GLOVE) ×4 IMPLANT
GLOVE BIOGEL PI IND STRL 7.5 (GLOVE) ×3 IMPLANT
GLOVE BIOGEL PI INDICATOR 7.0 (GLOVE) ×4
GLOVE BIOGEL PI INDICATOR 7.5 (GLOVE) ×3
GLOVE ECLIPSE 6.0 STRL STRAW (GLOVE) ×2 IMPLANT
GLOVE ECLIPSE 6.5 STRL STRAW (GLOVE) ×2 IMPLANT
GOWN PREVENTION PLUS LG XLONG (DISPOSABLE) ×10 IMPLANT
GOWN STRL REIN XL XLG (GOWN DISPOSABLE) ×8 IMPLANT
NEEDLE HYPO 22GX1.5 SAFETY (NEEDLE) ×2 IMPLANT
NEEDLE SPNL 20GX3.5 QUINCKE YW (NEEDLE) ×2 IMPLANT
NEEDLE SPNL 22GX3.5 QUINCKE BK (NEEDLE) IMPLANT
NS IRRIG 1000ML POUR BTL (IV SOLUTION) ×4 IMPLANT
PACK VAGINAL WOMENS (CUSTOM PROCEDURE TRAY) ×4 IMPLANT
PAD OB MATERNITY 4.3X12.25 (PERSONAL CARE ITEMS) ×2 IMPLANT
SET CYSTO W/LG BORE CLAMP LF (SET/KITS/TRAYS/PACK) ×2 IMPLANT
SLING TRANS VAGINAL TAPE (Sling) ×1 IMPLANT
SLING UTERINE/ABD GYNECARE TVT (Sling) ×1 IMPLANT
SUT MNCRL AB 3-0 PS2 27 (SUTURE) IMPLANT
SUT MNCRL AB 4-0 PS2 18 (SUTURE) IMPLANT
SUT VIC AB 0 CT1 18XCR BRD8 (SUTURE) ×3 IMPLANT
SUT VIC AB 0 CT1 27 (SUTURE) ×7
SUT VIC AB 0 CT1 27XBRD ANBCTR (SUTURE) ×7 IMPLANT
SUT VIC AB 0 CT1 8-18 (SUTURE) ×3
SUT VIC AB 2-0 CT1 27 (SUTURE)
SUT VIC AB 2-0 CT1 TAPERPNT 27 (SUTURE) IMPLANT
SUT VIC AB 2-0 CT2 27 (SUTURE) ×8 IMPLANT
SUT VIC AB 2-0 SH 27 (SUTURE) ×8
SUT VIC AB 2-0 SH 27XBRD (SUTURE) ×8 IMPLANT
SUT VIC AB 3-0 SH 27 (SUTURE) ×2
SUT VIC AB 3-0 SH 27XBRD (SUTURE) ×2 IMPLANT
SUT VICRYL 0 TIES 12 18 (SUTURE) ×2 IMPLANT
SYR TB 1ML 25GX5/8 (SYRINGE) IMPLANT
TOWEL OR 17X24 6PK STRL BLUE (TOWEL DISPOSABLE) ×8 IMPLANT
TRAY FOLEY CATH 14FR (SET/KITS/TRAYS/PACK) ×4 IMPLANT
WATER STERILE IRR 1000ML POUR (IV SOLUTION) ×4 IMPLANT

## 2013-04-02 NOTE — Anesthesia Postprocedure Evaluation (Signed)
Anesthesia Post Note  Patient: Sabrina Hunter  Procedure(s) Performed: Procedure(s) (LRB): HYSTERECTOMY VAGINAL (N/A) ANTERIOR (CYSTOCELE) AND POSTERIOR REPAIR (RECTOCELE) (N/A) TRANSVAGINAL TAPE (TVT) PROCEDURE (N/A)  Anesthesia type: General  Patient location: PACU  Post pain: Pain level controlled  Post assessment: Post-op Vital signs reviewed  Last Vitals:  Filed Vitals:   04/02/13 1300  BP: 102/50  Pulse: 81  Temp: 36.6 C  Resp: 18    Post vital signs: Reviewed  Level of consciousness: sedated  Complications: No apparent anesthesia complications

## 2013-04-02 NOTE — Transfer of Care (Signed)
Immediate Anesthesia Transfer of Care Note  Patient: Sabrina Hunter  Procedure(s) Performed: Procedure(s): HYSTERECTOMY VAGINAL (N/A) ANTERIOR (CYSTOCELE) AND POSTERIOR REPAIR (RECTOCELE) (N/A) TRANSVAGINAL TAPE (TVT) PROCEDURE (N/A)  Patient Location: PACU  Anesthesia Type:General  Level of Consciousness: awake, oriented, sedated and patient cooperative  Airway & Oxygen Therapy: Patient Spontanous Breathing and Patient connected to nasal cannula oxygen  Post-op Assessment: Report given to PACU RN and Post -op Vital signs reviewed and stable  Post vital signs: Reviewed and stable  Complications: No apparent anesthesia complications

## 2013-04-02 NOTE — H&P (View-Only) (Signed)
Sabrina Hunter is a 37 y.o.  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 For the past year the patient's menstrual pattern has been monthly with clots for 7-10 days requiring her to change her pad every 1.5 hours.  She has gone as many as 2 months without a period but no such occurrence recently.  She goes on to report stabbing intermittent pains in her pelvic area that are not brought on by anything she can identify.  Those pains, however,  as well as her menstrual cramps are relieved with Advil.  She admits  to dyspareunia, constipation and occasional lower back pain.  For the past six months the patient has also noticed that with extensive walking, sneezing or coughing she will leak urine and has to wear a Poise Pad.  At times she'll have urinary hesitancy, urgency, lots of pressure when she has to void but will only void a scant amount.   Nooctura x 3 has also become a daily event.  Consequently the patient underwent  urodynamic studies (Lumax) that confirmed mixed urinary incontinence.  On physical exam the patient was found to have pelvic prolapse that extended 2/3 the length of the vagina. . A pelvic ultrasound in May showed a uterus-7.77 x 7.23 x 7.13 with a single  fibroid-1.99 x 1.58 x 1,87 cm (posterior intramural). Left ovary was 3.74 x 2.22 x 2.49 cm and right ovary 3.01 x 1.93 x 2.01 cm.  After a review of both medical and surgical management options for incontinence, pelvic prolapse and dysfunctional uterine bleeding, the patient decided to proceed with surgical management.  Past Medical History  OB History: G4; P 2-0-2-2;  SVD  2007 (5lbs 8 oz)  & 2001 (5lbs 5 oz)  GYN History: menarche: 37 YO   LMP: Oligomenorrhea    Contracepton bilateral tubal ligation  The patient denies history of sexually transmitted disease.  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   ROS: Admits to glasses and braces on teeth (upper & lower), anterior thigh achiness  but   denies headache/vision changes (except with migraines), nasal congestion, dysphagia, tinnitus, dizziness, hoarseness, cough,  chest pain, shortness of breath, nausea, vomiting, diarrhea,constipation,  urinary frequency, urgency  dysuria, hematuria, vaginitis symptoms, pelvic pain, swelling of joints,easy bruising, arthralgias, skin rashes, unexplained weight loss and except as is mentioned in the history of present illness, patient's review of systems is otherwise negative.  Physical Exam  Bp: 106/80;   P: 85 ;    T 98.2 degrees F orally;    Weight: 206lbs           Height: 4'11"         BM: 41.6  Neck: supple without masses or thyromegaly Lungs: clear to auscultation Heart: regular rate and rhythm Abdomen: soft, non-tender and no organomegaly Pelvic:EGBUS- wnl; vagina-with 3/4 cystocele and  1/4 rectocele; uterus-normal size and prolapsed to 2-3/4;  cervix without lesions or motion tenderness; adnexae-no tenderness or masses (exam limited by habitus) Extremities:  no clubbing, cyanosis or edema   Assesment:  Pelvic Prolapse            Dysfunctional Uterine Bleeding                       Mixed Urinary Incontinence   Disposition:  A discussion was held with patient regarding the indication for her procedure(s)  along with the risks, which include but are not limited to: reaction to anesthesia, damage to adjacent organs, infection, excessive bleeding, pelvic prolapse, possible need for an open abdominal incision, erosion of tension free vaginal tape and worsening of bladder symptoms.  The patient verbalized understanding of these risks and has consented to proceed with a Total Vaginal Hysterectomy, Anterior-Posterior Repair, Placement of Tension Free Vaginal Tape, Possible Salpingectomy and Cystoscopy at [facility] on April 02, 2013.   CSN# [id number]   Suhayla Chisom J. Lowell Guitar, PA-C  for Dr. Crist Fat.Rivard

## 2013-04-02 NOTE — Op Note (Addendum)
Preoperative diagnosis: Uterine prolapse, cystocele, rectocele, mixed urinary incontinence  Postoperative diagnosis: Same  Anesthesia: Gen.  Anesthesiologist: Dr. Brayton Caves  Procedure: Total vaginal hysterectomy with anterior and posterior repair. TVT.  Surgeon: Dr. Dois Davenport Anesha Hackert and Dr. Osborn Coho  Estimated blood loss: 150 cc  Procedure:  After being informed of the planned procedure with possible complications including but not limited to bleeding, infection, injury to other organs, need for laparotomy, expected hospitals they in recovery, informed consent is obtained and patient is taken to or #8. She is given general anesthesia with endotracheal intubation without any complication. She's placed in the lithotomy position prepped and draped in a sterile fashion and a Foley catheter is inserted in her bladder.  A weighted speculum is inserted in the vagina and the anterior lip of the cervix is grasped with a tenaculum forcep. We infiltrate the vaginal mucosa around the cervix using lidocaine 1% with epinephrine 1 in 200,000. We then perform a 360 colpotomy allowing [place] to sharply and bluntly dissect the anterior and the posterior vaginal wall. This gives [place] access to the posterior cul-de-sac which was opened sharply. Both uterosacral ligaments were then clamped with Rogers forceps section and sutured with a transfix suture of 0 Vicryl maintained for future suspension. We can then isolate the uterine vessels on both sides with Rogers forceps. These vessels are then sectioned and sutured with a 0 Vicryl. Retracting bladder further up we have access to the anterior cul-de-sac which is opened sharply and allows [place] to retract bladder anteriorly. Broad ligament is then clamped on both sides with Rogers forcep sectioned and sutured with a transfix suture of 0 Vicryl. We are now able to deliver the fundus of the uterus easily via the posterior cul-de-sac in order to isolate the final pedicle  which will incorporate utero-ovarian ligament, tube and round ligament. This pedicle is then sectioned and doubly sutured with transfix suture of 0 Vicryl. Hemostasis is then systematically reviewed on each pedicle and completed on the right uterosacral ligament with a figure-of-eight stitch of 0 Vicryl. We can now place a vaginal cuff suspension sutures using 0 Vicryl on both sides uniting posterior vaginal mucosa posterior peritoneum uterosacral ligament anterior peritoneum. The sutures are not tied yet. We modify retraction so we can grasp the anterior vaginal mucosa with Allis forceps and infiltrate it with lidocaine 1% epinephrine 1 200,000. We then dissect sharply midline all the way to 1 cm from the urethra. The vaginal mucosa was then sharply and bluntly dissected away from the prevesical fascia until we can reduce the cystocele completely. In multiple layers we then proceed with the anterior repair plicating the prevesical fascia using 2-0 Vicryl used stitches. At this time, surgical time is transferred to Dr. Su Hilt who proceeds with placement of the TVT. This operative report will be dictated separately.  We then closed the anterior vaginal mucosa with figure-of-eight stitches of 0 Vicryl. The vaginal cuff is closed transversely with figure-of-eight stitches of 0 Vicryl. The vaginal cuff suspension sutures are then tied.  We isolate the posterior fourchette on 2 Allis clamps on the distance of 3 cm. The perineum and posterior vaginal mucosa are then infiltrated with lidocaine 1% epinephrine 1 in 200. AV incision is performed on the perineum and excess perineal skin is excised. We then sharply dissect the posterior vaginal mucosa from the prerectal fascia midline on a distance of 6 cm. This vaginal mucosa is further dissected away sharply and bluntly until we can correct the rectocele. Using 2-0  Vicryl we plicated the prerectal fascia in multiple layers with U-sutures. The excess vaginal mucosa is  excised. The posterior vaginal mucosa is then closed with a running lock suture of 2-0 Vicryl. Peritoneal muscles are reapproximated with 2 stitches of 0 Vicryl. Perineal skin is closed with subcuticular suture of 3-0 Vicryl. The vagina is then packed with a 1 inch packing with Estrace cream.  Instrument and sponge count is complete x2. Estimated blood loss is 150 cc. The procedure is well tolerated by the patient is taken to recovery room in a well and stable condition.  Specimen: Uterus weighing 214 g sent to pathology

## 2013-04-02 NOTE — Interval H&P Note (Signed)
History and Physical Interval Note:  04/02/2013 9:29 AM  Sabrina Hunter  has presented today for surgery, with the diagnosis of Pelvic Prolapse with SUI  The various methods of treatment have been discussed with the patient and family. After consideration of risks, benefits and other options for treatment, the patient has consented to  Procedure(s): HYSTERECTOMY VAGINAL (N/A) ANTERIOR (CYSTOCELE) AND POSTERIOR REPAIR (RECTOCELE) (N/A) TRANSVAGINAL TAPE (TVT) PROCEDURE (N/A) as a surgical intervention .  The patient's history has been reviewed, patient examined, no change in status, stable for surgery.  I have reviewed the patient's chart and labs.  Questions were answered to the patient's satisfaction.     Vici Novick A

## 2013-04-02 NOTE — Op Note (Signed)
Preop Diagnosis: SUI  Postop Diagnosis: SUI  Procedure: 1.TVT 2. Cystoscopy  Assistant: Dr.Rivard  Complications:none  Procedure:  The patient was already undergoing surgery and s/p the [facility] and anterior repair portion.  The risks, benefits and alternatives had previously been discussed with the patient, the patient verbalized understanding and consent was signed and witnessed.  An incision was made in the anterior wall of the vagina for approximately 1cm beneath the midurethra and the underlying tissue was dissected away from the anterior vaginal wall down to the level of the lower symphysis pubis bilaterally. Attention was then turned to the mons pubis where two 5 mm incisions were made 2 fingerbreadths from the midline. The transabdominal guide was then passed through the mons pubis incision on the patient's right down through the space of Retzius and out through the anterior vaginal wall after deflecting the rigid urethral catheter guide to the ipsilateral side. The same was done on the contralateral side. Cystoscopy was performed and no invadvertant bladder injury was noted. The bladder was drained with a Foley while deflecting the rigid urethral catheter guide to the patient's right and the mesh was attached to the transabdominal guide and elevated up through the space of Retzius and out through the incision on the mons pubis on the ipsilateral side. The same was done on the contralateral side. Cystoscopy was performed again and no inadvertant bladder injury was noted. The 87 French Foley was left in the urethra and a large Tresa Endo was placed between the urethra and the mesh in order to leave the mesh slack beneath the midurethra. The mesh was then cut flush with the skin at the mons pubis incisions bilaterally. Indigo carmine had been administered, cystoscopy was performed again and bilateral ureters were noted to efflux without difficulty. The bilateral incisions on the mons pubis were then cleaned  and Dermabond applied. The anterior vaginal wall incision was repaired by Dr. Estanislado Pandy along with her repair for the anterior vaginal wall for the anterior repair.  Dr. Estanislado Pandy continued on with the remainder of the posterior repair.

## 2013-04-02 NOTE — Anesthesia Preprocedure Evaluation (Signed)
Anesthesia Evaluation  Patient identified by MRN, date of birth, ID band Patient awake    Reviewed: Allergy & Precautions, H&P , Patient's Chart, lab work & pertinent test results, reviewed documented beta blocker date and time   History of Anesthesia Complications Negative for: history of anesthetic complications  Airway Mallampati: III TM Distance: >3 FB Neck ROM: full    Dental no notable dental hx.    Pulmonary neg pulmonary ROS,  breath sounds clear to auscultation  Pulmonary exam normal       Cardiovascular Exercise Tolerance: Good negative cardio ROS  Rhythm:regular Rate:Normal     Neuro/Psych  Headaches, negative neurological ROS  negative psych ROS   GI/Hepatic negative GI ROS, Neg liver ROS,   Endo/Other  negative endocrine ROSMorbid obesity  Renal/GU negative Renal ROS     Musculoskeletal   Abdominal   Peds  Hematology negative hematology ROS (+)   Anesthesia Other Findings   Reproductive/Obstetrics negative OB ROS                           Anesthesia Physical Anesthesia Plan  ASA: III  Anesthesia Plan: General ETT   Post-op Pain Management:    Induction:   Airway Management Planned:   Additional Equipment:   Intra-op Plan:   Post-operative Plan:   Informed Consent: I have reviewed the patients History and Physical, chart, labs and discussed the procedure including the risks, benefits and alternatives for the proposed anesthesia with the patient or authorized representative who has indicated his/her understanding and acceptance.   Dental Advisory Given  Plan Discussed with: CRNA and Surgeon  Anesthesia Plan Comments:         Anesthesia Quick Evaluation

## 2013-04-03 ENCOUNTER — Encounter (HOSPITAL_COMMUNITY): Payer: Self-pay | Admitting: Obstetrics and Gynecology

## 2013-04-03 LAB — URINE MICROSCOPIC-ADD ON

## 2013-04-03 LAB — URINALYSIS, ROUTINE W REFLEX MICROSCOPIC
Bilirubin Urine: NEGATIVE
Ketones, ur: NEGATIVE mg/dL
Nitrite: NEGATIVE
Protein, ur: NEGATIVE mg/dL
Specific Gravity, Urine: <1.005 — ABNORMAL LOW (ref 1.005–1.030)
Urobilinogen, UA: 0.2 mg/dL (ref 0.0–1.0)

## 2013-04-03 LAB — CBC
MCH: 28.4 pg (ref 26.0–34.0)
MCHC: 34.2 g/dL (ref 30.0–36.0)
MCV: 82.9 fL (ref 78.0–100.0)
Platelets: 273 10*3/uL (ref 150–400)
RDW: 13.2 % (ref 11.5–15.5)
WBC: 18.2 10*3/uL — ABNORMAL HIGH (ref 4.0–10.5)

## 2013-04-03 MED ORDER — OXYCODONE-ACETAMINOPHEN 5-325 MG PO TABS: 1.0000 | ORAL_TABLET | ORAL | Status: DC | PRN

## 2013-04-03 MED ORDER — ONDANSETRON HCL 4 MG PO TABS: 4.0000 mg | ORAL_TABLET | Freq: Four times a day (QID) | ORAL | Status: DC | PRN

## 2013-04-03 MED ORDER — IBUPROFEN 600 MG PO TABS: 600.0000 mg | ORAL_TABLET | Freq: Four times a day (QID) | ORAL | Status: DC | PRN

## 2013-04-03 NOTE — Progress Notes (Signed)
1 Day Post-Op Procedure(s) (LRB): HYSTERECTOMY VAGINAL (N/A) ANTERIOR (CYSTOCELE) AND POSTERIOR REPAIR (RECTOCELE) (N/A) TRANSVAGINAL TAPE (TVT) PROCEDURE (N/A)  Subjective: Patient reports that pain is well managed.  Tolerating normal  diet without difficulty. No nausea / vomiting.  Ambulating . Voiding 100-200 cc but full and uncomfortable feeling persists  Objective: BP 110/65  Pulse 72  Temp(Src) 97.9 F (36.6 C) (Oral)  Resp 18  Ht 5' (1.524 m)  Wt 200 lb (90.719 kg)  BMI 39.06 kg/m2  SpO2 99%  LMP 03/31/2013 Lungs: clear Heart: normal rate and rhythm Abdomen:soft and appropriately tender Extremities: Homans sign is negative, no sign of DVT   Assessment: s/p Procedure(s): HYSTERECTOMY VAGINAL ANTERIOR (CYSTOCELE) AND POSTERIOR REPAIR (RECTOCELE) TRANSVAGINAL TAPE (TVT) PROCEDURE: urinary retention but o/w doing very well  Plan: in and out cath now reassurance Possible discharge later or in am  LOS: 1 day    Sabrina Hunter A 04/03/2013, 1:43 PM

## 2013-04-03 NOTE — Anesthesia Postprocedure Evaluation (Signed)
Anesthesia Post Note  Patient: Sabrina Hunter  Procedure(s) Performed: Procedure(s) (LRB): HYSTERECTOMY VAGINAL (N/A) ANTERIOR (CYSTOCELE) AND POSTERIOR REPAIR (RECTOCELE) (N/A) TRANSVAGINAL TAPE (TVT) PROCEDURE (N/A)  Anesthesia type: General  Patient location: [facility]  Post pain: Pain level controlled  Post assessment: Post-op Vital signs reviewed  Last Vitals:  Filed Vitals:   04/03/13 1000  BP: 110/65  Pulse: 72  Temp: 36.6 C  Resp: 18    Post vital signs: Reviewed  Level of consciousness: sedated  Complications: No apparent anesthesia complications

## 2013-04-03 NOTE — Progress Notes (Signed)
Vaginal packing removed after foley catheter discontinued.  Packing slightly soaked with dark red blood.  OOB with assist, no active vaginal bleeding noted.

## 2013-04-03 NOTE — Progress Notes (Addendum)
Subjective: Patient reports nausea, vomiting, incisional pain and tolerating PO.  Pt tolerated breakfast and percocet this morning though.  She feels pressure and feels like she is not emptying completely.  Bladder was scanned with increased residual.  Objective: I have reviewed patient's vital signs and intake and output.  General: alert and no distress Resp: clear to auscultation bilaterally Cardio: regular rate and rhythm GI: soft, BS present slightly decreased, incisions c/d/intact with dermabond, app tender, ND Extremities: extremities normal, atraumatic, no cyanosis or edema and Homans sign is negative, no sign of DVT Vaginal Bleeding: only residual spotting with wiping   Assessment/Plan: ?urinary retention otherwise doing ok Continue post op care Pt to void again and if does not feel like nl void, rec straight cath and call me with result    LOS: 1 day    Lyrique Hakim Y 04/03/2013, 1:13 PM   Pt voided about 320cc over 4hrs but went 170cc and still felt pressure and then tried again for a total of about 320cc.  She felt better after that though.  I discussed options.  Monitor overnight secondary to urinary retention and replace foley or have pt try to void q3-4hrs.  Pt wants to void every 3-4hrs.  Will reeval tomorrow.

## 2013-04-04 MED ORDER — CIPROFLOXACIN HCL 250 MG PO TABS: 250.0000 mg | ORAL_TABLET | Freq: Two times a day (BID) | ORAL | Status: DC

## 2013-04-04 MED ORDER — IBUPROFEN 600 MG PO TABS: 600.0000 mg | ORAL_TABLET | Freq: Four times a day (QID) | ORAL | Status: DC | PRN

## 2013-04-04 NOTE — Progress Notes (Signed)
Pt discharged to home with husband and mother.  Condition stable.  Pt to car via wheelchair with RN.  No equipment for home ordered at discharge.

## 2013-04-04 NOTE — Discharge Summary (Signed)
Physician Discharge Summary  Patient ID: Sabrina Hunter MRN: [id number] DOB/AGE: [birth year] 37 y.o.  Admit date: 04/02/2013 Discharge date: 04/04/2013  Admission Diagnoses: Pelvic Prolapse with SUI  Discharge Diagnoses:  Active Problems:   * No active hospital problems. *   Discharged Condition: good  Hospital Course: s/p TVH/A-P Repair/TVT/Cystoscopy doing well on POD #2 voiding spontaneously, tolerating po and ambulating well.  On POD#1, pt was voiding but with some urinary retention and feeling pressure but otherwise doing well.  Discharged to home on day 2 with pain meds and cipro.  UCx sent and pending.  Consults: None  Significant Diagnostic Studies: labs: UCx sent and pending  Treatments: IV hydration and surgery: s/p the above  Discharge Exam: Blood pressure 101/68, pulse 90, temperature 98 F (36.7 C), temperature source Oral, resp. rate 18, height 5' (1.524 m), weight 90.719 kg (200 lb), last menstrual period 03/31/2013, SpO2 99.00%. General appearance: alert and no distress Resp: clear to auscultation bilaterally Cardio: regular rate and rhythm GI: soft, app tender, NABS, ND Pelvic: minimal vaginal bleeding (digital pelvic not performed) Extremities: Homans sign is negative, no sign of DVT Incision/Wound:c/d/i with dermabond  Disposition: 01-Home or Self Care     Medication List         ciprofloxacin 250 MG tablet  Commonly known as:  CIPRO  Take 1 tablet (250 mg total) by mouth every 12 (twelve) hours. For 3 days     ciprofloxacin 250 MG tablet  Commonly known as:  CIPRO  Take 1 tablet (250 mg total) by mouth every 12 (twelve) hours. For 3 days     ibuprofen 600 MG tablet  Commonly known as:  ADVIL,MOTRIN  Take 1 tablet (600 mg total) by mouth every 6 (six) hours as needed (mild pain).     omeprazole-sodium bicarbonate 40-1100 MG per capsule  Commonly known as:  ZEGERID  Take 1 capsule by mouth daily as needed (acid reflux).     ondansetron 4  MG tablet  Commonly known as:  ZOFRAN  Take 1 tablet (4 mg total) by mouth every 6 (six) hours as needed for nausea.     oxyCODONE-acetaminophen 5-325 MG per tablet  Commonly known as:  PERCOCET/ROXICET  Take 1-2 tablets by mouth every 4 (four) hours as needed.     topiramate 25 MG tablet  Commonly known as:  TOPAMAX  Take 25 mg by mouth daily as needed (migraine).           Follow-up Information   Follow up with Purcell Nails, MD In 4 weeks. (and with Dr. Estanislado Pandy in 6wks)    Contact information:   [address] [place] [address] [phone]       Follow up with [facility] A, MD In 6 weeks.   Contact information:   [address] [place] [address] [phone]       Signed: Purcell Nails 04/04/2013, 10:55 AM

## 2013-04-06 LAB — URINE CULTURE

## 2014-07-21 ENCOUNTER — Encounter (HOSPITAL_COMMUNITY): Payer: Self-pay | Admitting: Obstetrics and Gynecology

## 2015-01-20 ENCOUNTER — Other Ambulatory Visit: Payer: Self-pay | Admitting: Specialist

## 2015-01-20 DIAGNOSIS — R519 Headache, unspecified: Secondary | ICD-10-CM

## 2015-01-20 DIAGNOSIS — R51 Headache: Principal | ICD-10-CM

## 2015-01-29 ENCOUNTER — Other Ambulatory Visit: Payer: Self-pay

## 2016-02-12 ENCOUNTER — Ambulatory Visit (INDEPENDENT_AMBULATORY_CARE_PROVIDER_SITE_OTHER): Payer: Self-pay | Admitting: Neurology

## 2016-02-12 ENCOUNTER — Ambulatory Visit (INDEPENDENT_AMBULATORY_CARE_PROVIDER_SITE_OTHER): Payer: BLUE CROSS/BLUE SHIELD | Admitting: Neurology

## 2016-02-12 ENCOUNTER — Encounter: Payer: Self-pay | Admitting: Neurology

## 2016-02-12 DIAGNOSIS — M609 Myositis, unspecified: Secondary | ICD-10-CM

## 2016-02-12 DIAGNOSIS — IMO0001 Reserved for inherently not codable concepts without codable children: Secondary | ICD-10-CM

## 2016-02-12 DIAGNOSIS — M791 Myalgia: Secondary | ICD-10-CM

## 2016-02-12 NOTE — Progress Notes (Signed)
Please refer to EMG and nerve conduction study procedure note. 

## 2016-02-12 NOTE — Procedures (Signed)
HISTORY:  Sabrina Hunter is a 40 year old patient with a one-year history of myalgias involving the arms and legs and back. The patient has had significant increase in symptoms over the last 1-2 months, no definite muscle weakness has been noted. The patient has been noted to have a minimal elevation of the CK enzyme level to 166 with an upper limit of normal for the lab of 140. The patient is being evaluated for possible myopathic disorder.  NERVE CONDUCTION STUDIES:  Nerve conduction studies were performed on the left upper extremity. The distal motor latencies and motor amplitudes for the median and ulnar nerves were within normal limits. The F wave latencies and nerve conduction velocities for these nerves were also normal. The sensory latencies for the median and ulnar nerves were normal.  Nerve conduction studies were performed on the left lower extremity. The distal motor latencies and motor amplitudes for the peroneal and posterior tibial nerves were within normal limits. The nerve conduction velocities for these nerves were also normal. The sensory latency for the peroneal nerve was within normal limits.   EMG STUDIES:  EMG study was performed on the left upper extremity:  The first dorsal interosseous muscle reveals 2 to 4 K units with full recruitment. No fibrillations or positive waves were noted. The abductor pollicis brevis muscle reveals 2 to 4 K units with full recruitment. No fibrillations or positive waves were noted. The extensor indicis proprius muscle reveals 1 to 3 K units with full recruitment. No fibrillations or positive waves were noted. The pronator teres muscle reveals 2 to 3 K units with full recruitment. No fibrillations or positive waves were noted. The biceps muscle reveals 1 to 2 K units with full recruitment. No fibrillations or positive waves were noted. The triceps muscle reveals 2 to 4 K units with full recruitment. No fibrillations or positive  waves were noted. The anterior deltoid muscle reveals 2 to 3 K units with full recruitment. No fibrillations or positive waves were noted. The cervical paraspinal muscles were tested at 2 levels. No abnormalities of insertional activity were seen at either level tested. There was good relaxation.  EMG study was performed on the left lower extremity:  The tibialis anterior muscle reveals 2 to 4K motor units with full recruitment. No fibrillations or positive waves were seen. The peroneus tertius muscle reveals 2 to 4K motor units with full recruitment. No fibrillations or positive waves were seen. The medial gastrocnemius muscle reveals 1 to 3K motor units with full recruitment. No fibrillations or positive waves were seen. The vastus lateralis muscle reveals 2 to 4K motor units with full recruitment. No fibrillations or positive waves were seen. The iliopsoas muscle reveals 2 to 4K motor units with full recruitment. No fibrillations or positive waves were seen. The biceps femoris muscle (long head) reveals 2 to 4K motor units with full recruitment. No fibrillations or positive waves were seen. The lumbosacral paraspinal muscles were tested at 3 levels, and revealed no abnormalities of insertional activity at all 3 levels tested. There was good relaxation.   IMPRESSION:  Nerve conduction studies done on the left upper and left lower extremities were within normal limits. No evidence of a neuropathy is seen. EMG evaluation of the left upper and left lower extremities were unremarkable, without evidence of an overlying cervical or lumbosacral radiculopathy, and no evidence of a myopathic disorder.  Jill Alexanders MD 02/12/2016 11:02 AM  [facility] [address] [place], [place]  [address]  Phone [phone] Fax [phone]

## 2017-05-10 ENCOUNTER — Encounter (INDEPENDENT_AMBULATORY_CARE_PROVIDER_SITE_OTHER): Payer: Self-pay | Admitting: Internal Medicine

## 2017-05-10 ENCOUNTER — Encounter (INDEPENDENT_AMBULATORY_CARE_PROVIDER_SITE_OTHER): Payer: Self-pay

## 2017-05-31 ENCOUNTER — Encounter (INDEPENDENT_AMBULATORY_CARE_PROVIDER_SITE_OTHER): Payer: Self-pay | Admitting: *Deleted

## 2017-05-31 ENCOUNTER — Encounter (INDEPENDENT_AMBULATORY_CARE_PROVIDER_SITE_OTHER): Payer: Self-pay | Admitting: Internal Medicine

## 2017-05-31 ENCOUNTER — Ambulatory Visit (INDEPENDENT_AMBULATORY_CARE_PROVIDER_SITE_OTHER): Payer: Self-pay | Admitting: Internal Medicine

## 2017-05-31 VITALS — BP 138/90 | HR 72 | Temp 98.4°F | Ht 60.0 in | Wt 208.0 lb

## 2017-05-31 DIAGNOSIS — R112 Nausea with vomiting, unspecified: Secondary | ICD-10-CM

## 2017-05-31 DIAGNOSIS — R1032 Left lower quadrant pain: Secondary | ICD-10-CM

## 2017-05-31 NOTE — Progress Notes (Addendum)
Subjective:    Patient ID: Sabrina Hunter, female    DOB: 14-Dec-1975, 41 y.o.   MRN: [id number]  HPI Referred by Dr Scotty Court for GERD and LLQ pain. LLQ pain since February.  Saw Dr. Adah Perl and normal exam. US revealed gallstones. The pain radiates from her left flank into her back.  She says she had and EGD in March of this year by Dr. Ladona Horns and was normal. She saw she alternates between constipation and diarrhea. She says she stays nauseated. Has a BM daily. Some are loose and some are formed.  She sometimes has nausea before and after she eats. She is wondering if it is her GB.  She says she has nausea every day.  09/27/2016 EGD: Dr. 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    05/17/2012 EGD/ED/Colonoscopy:  Change in stool, dysphagia, GERD. Cecal Withdrawal Time:  7 minutes  Impression:  Small sliding hiatal hernia otherwise normal EGD. Esophagus dilated by passing 54 French Maloney dilator but no mucosal disruption noted. Normal colonoscopy except small external hemorrhoids. Review of Systems Past Medical History:  Diagnosis Date  . Continuous urine leakage   . Family history of anesthesia complication    pt.'s father was extremely difficult to wake after anesthesia-emergency situation per pt.  . Hernia    hiatal hernia  . Migraines    takes Topamax prn  . Migraines   . SVD (spontaneous vaginal delivery)    x 2    Past Surgical History:  Procedure Laterality Date  . ANTERIOR AND POSTERIOR REPAIR N/A 04/02/2013   Procedure: ANTERIOR (CYSTOCELE) AND POSTERIOR REPAIR (RECTOCELE);  Surgeon: Alwyn Pea, MD;  Location: [facility];  Service: Gynecology;  Laterality: N/A;  . BALLOON DILATION  05/17/2012   Procedure: BALLOON DILATION;  Surgeon: Rogene Houston, MD;  [facility];  Service: Endoscopy;  Laterality: N/A;  . BLADDER SUSPENSION N/A 04/02/2013   Procedure: TRANSVAGINAL TAPE (TVT) PROCEDURE;  Surgeon: Delice Lesch, MD;  Location: [facility];  Service:  Gynecology;  Laterality: N/A;  . COLONOSCOPY  2013  . MALONEY DILATION  05/17/2012   Procedure: MALONEY DILATION;  Surgeon: Rogene Houston, MD;  [facility];  Service: Endoscopy;  Laterality: N/A;  . SAVORY DILATION  05/17/2012   Procedure: SAVORY DILATION;  Surgeon: Rogene Houston, MD;  [facility];  Service: Endoscopy;  Laterality: N/A;  . TUBAL LIGATION     2007  . VAGINAL HYSTERECTOMY N/A 04/02/2013   Procedure: HYSTERECTOMY VAGINAL;  Surgeon: Alwyn Pea, MD;  Location: [facility];  Service: Gynecology;  Laterality: N/A;  . WISDOM TOOTH EXTRACTION      No Known Allergies  Current Outpatient Prescriptions on File Prior to Visit  Medication Sig Dispense Refill  . ondansetron (ZOFRAN) 4 MG tablet Take 1 tablet (4 mg total) by mouth every 6 (six) hours as needed for nausea. 20 tablet 0   No current facility-administered medications on file prior to visit.         Objective:   Physical Exam Blood pressure 138/90, pulse 72, temperature 98.4 F (36.9 C), height 5' (1.524 m), weight 208 lb (94.3 kg). Alert and oriented. Skin warm and dry. Oral mucosa is moist.   . Sclera anicteric, conjunctivae is pink. Thyroid not enlarged. No cervical lymphadenopathy. Lungs clear. Heart regular rate and rhythm.  Abdomen is soft. Bowel sounds are positive. No hepatomegaly. No abdominal masses felt. No tenderness.  No edema to lower extremities.  Assessment & Plan:  LLQ pain. [place] and CT was normal. Recent EGD was normal. Will get CT report and [place] report. She states she had kidney stone on CT.  Nausea: Will get a HIDA scan to rule out GB disease. CBC and Hepatic function.

## 2017-05-31 NOTE — Patient Instructions (Signed)
HIDA scan. Hepatic function.

## 2017-06-01 LAB — CBC WITH DIFFERENTIAL/PLATELET
BASOS ABS: 39 {cells}/uL (ref 0–200)
BASOS PCT: 0.5 %
EOS ABS: 172 {cells}/uL (ref 15–500)
Eosinophils Relative: 2.2 %
HEMATOCRIT: 37.6 % (ref 35.0–45.0)
HEMOGLOBIN: 12.9 g/dL (ref 11.7–15.5)
Lymphs Abs: 2254 cells/uL (ref 850–3900)
MCH: 28.7 pg (ref 27.0–33.0)
MCHC: 34.3 g/dL (ref 32.0–36.0)
MCV: 83.6 fL (ref 80.0–100.0)
MONOS PCT: 7.2 %
MPV: 10.1 fL (ref 7.5–12.5)
NEUTROS PCT: 61.2 %
Neutro Abs: 4774 cells/uL (ref 1500–7800)
Platelets: 305 10*3/uL (ref 140–400)
RBC: 4.5 10*6/uL (ref 3.80–5.10)
RDW: 12.6 % (ref 11.0–15.0)
Total Lymphocyte: 28.9 %
WBC mixed population: 562 cells/uL (ref 200–950)
WBC: 7.8 10*3/uL (ref 3.8–10.8)

## 2017-06-01 LAB — HEPATIC FUNCTION PANEL
AG Ratio: 1.5 (calc) (ref 1.0–2.5)
ALKALINE PHOSPHATASE (APISO): 48 U/L (ref 33–115)
ALT: 16 U/L (ref 6–29)
AST: 17 U/L (ref 10–30)
Albumin: 4.1 g/dL (ref 3.6–5.1)
BILIRUBIN DIRECT: 0.1 mg/dL (ref 0.0–0.2)
BILIRUBIN INDIRECT: 0.2 mg/dL (ref 0.2–1.2)
GLOBULIN: 2.7 g/dL (ref 1.9–3.7)
Total Bilirubin: 0.3 mg/dL (ref 0.2–1.2)
Total Protein: 6.8 g/dL (ref 6.1–8.1)

## 2017-06-12 ENCOUNTER — Encounter (HOSPITAL_COMMUNITY): Payer: Self-pay

## 2017-06-12 ENCOUNTER — Encounter (HOSPITAL_COMMUNITY)
Admission: RE | Admit: 2017-06-12 | Discharge: 2017-06-12 | Disposition: A | Discharge status: Home / Self Care | Payer: Self-pay | Source: Ambulatory Visit | Attending: Internal Medicine | Admitting: Internal Medicine

## 2017-06-12 DIAGNOSIS — R112 Nausea with vomiting, unspecified: Secondary | ICD-10-CM | POA: Insufficient documentation

## 2017-06-12 HISTORY — DX: Essential (primary) hypertension: I10

## 2017-06-12 MED ORDER — TECHNETIUM TC 99M MEBROFENIN IV KIT
5.0000 | PACK | Freq: Once | INTRAVENOUS | Status: AC | PRN
  Administered 2017-06-12: 5.3 via INTRAVENOUS

## 2017-06-26 ENCOUNTER — Other Ambulatory Visit (INDEPENDENT_AMBULATORY_CARE_PROVIDER_SITE_OTHER): Payer: Self-pay | Admitting: Internal Medicine

## 2017-06-26 ENCOUNTER — Telehealth (INDEPENDENT_AMBULATORY_CARE_PROVIDER_SITE_OTHER): Payer: Self-pay | Admitting: Internal Medicine

## 2017-06-26 DIAGNOSIS — R1012 Left upper quadrant pain: Secondary | ICD-10-CM

## 2017-06-26 NOTE — Telephone Encounter (Signed)
Sabrina Hunter, US abdomen. Pain LLQ

## 2017-06-27 NOTE — Telephone Encounter (Signed)
[place] sch'd 07/04/17 at [phone] ([phone]), npo after midnight, patient aware

## 2017-07-04 ENCOUNTER — Ambulatory Visit (HOSPITAL_COMMUNITY): Payer: Self-pay

## 2017-07-14 ENCOUNTER — Ambulatory Visit (HOSPITAL_COMMUNITY): Admission: RE | Admit: 2017-07-14 | Payer: Self-pay | Source: Ambulatory Visit

## 2017-10-20 ENCOUNTER — Telehealth (INDEPENDENT_AMBULATORY_CARE_PROVIDER_SITE_OTHER): Payer: Self-pay | Admitting: *Deleted

## 2017-10-20 NOTE — Telephone Encounter (Signed)
That will be fine. 

## 2017-10-20 NOTE — Telephone Encounter (Signed)
yes

## 2017-10-20 NOTE — Telephone Encounter (Signed)
Wasn't able to get [place] you ordered in October due to not having insurance -- now has insurance, still having nausea and vomiting,also having LUQ pain, wants to know if she can have [place]

## 2017-10-23 NOTE — Telephone Encounter (Signed)
Spoke to patient, she got sick at work after talking to me last week and her dioctor there sent her for [place] which showed gallstones, she has appt with Dr Ladona Horns in [place] for surgery

## 2017-11-22 ENCOUNTER — Ambulatory Visit (INDEPENDENT_AMBULATORY_CARE_PROVIDER_SITE_OTHER): Payer: Commercial Managed Care - PPO | Admitting: Internal Medicine

## 2017-11-22 ENCOUNTER — Encounter (INDEPENDENT_AMBULATORY_CARE_PROVIDER_SITE_OTHER): Payer: Self-pay | Admitting: Internal Medicine

## 2017-11-22 VITALS — BP 150/86 | HR 72 | Temp 98.0°F | Ht 60.0 in | Wt 206.9 lb

## 2017-11-22 DIAGNOSIS — R110 Nausea: Secondary | ICD-10-CM | POA: Diagnosis not present

## 2017-11-22 NOTE — Progress Notes (Signed)
Subjective:    Patient ID: Sabrina Hunter, female    DOB: 1976-03-26, 42 y.o.   MRN: [id number]  HPI Here today for f/u. Last seen in September of 2018 with LLQ pain and GERD.  HIDA scan was normal. US abdomen at [facility] showed gallstones. She underwent cholecystectomy 10/31/2017.  She says the nausea has not resolved.  She has nausea almost every day. She says she is avoiding fatty foods. Her appetite is okay.  She says she has had some weight loss.  BMs are normal.   09/27/2016 EGD: Dr. 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    05/17/2012 EGD/ED/Colonoscopy:  Change in stool, dysphagia, GERD. Cecal Withdrawal Time: 7 minutes  Impression:  Small sliding hiatal hernia otherwise normal EGD. Esophagus dilated by passing 54 French Maloney dilator but no mucosal disruption noted. Normal colonoscopy except small external hemorrhoids. Review of Review of Systems   Past Medical History:  Diagnosis Date  . Continuous urine leakage   . Family history of anesthesia complication    pt.'s father was extremely difficult to wake after anesthesia-emergency situation per pt.  . Hernia    hiatal hernia  . Hypertension   . Migraines    takes Topamax prn  . Migraines   . SVD (spontaneous vaginal delivery)    x 2    Past Surgical History:  Procedure Laterality Date  . ANTERIOR AND POSTERIOR REPAIR N/A 04/02/2013   Procedure: ANTERIOR (CYSTOCELE) AND POSTERIOR REPAIR (RECTOCELE);  Surgeon: Alwyn Pea, MD;  Location: [facility];  Service: Gynecology;  Laterality: N/A;  . BALLOON DILATION  05/17/2012   Procedure: BALLOON DILATION;  Surgeon: Rogene Houston, MD;  [facility];  Service: Endoscopy;  Laterality: N/A;  . BLADDER SUSPENSION N/A 04/02/2013   Procedure: TRANSVAGINAL TAPE (TVT) PROCEDURE;  Surgeon: Delice Lesch, MD;  Location: [facility];  Service: Gynecology;  Laterality: N/A;  . COLONOSCOPY  2013  . MALONEY DILATION  05/17/2012   Procedure: MALONEY DILATION;  Surgeon:  Rogene Houston, MD;  [facility];  Service: Endoscopy;  Laterality: N/A;  . SAVORY DILATION  05/17/2012   Procedure: SAVORY DILATION;  Surgeon: Rogene Houston, MD;  [facility];  Service: Endoscopy;  Laterality: N/A;  . TUBAL LIGATION     2007  . VAGINAL HYSTERECTOMY N/A 04/02/2013   Procedure: HYSTERECTOMY VAGINAL;  Surgeon: Alwyn Pea, MD;  Location: [facility];  Service: Gynecology;  Laterality: N/A;  . WISDOM TOOTH EXTRACTION      No Known Allergies  Current Outpatient Medications on File Prior to Visit  Medication Sig Dispense Refill  . FLUoxetine (PROZAC) 20 MG capsule Take 20 mg by mouth daily.    . metoprolol tartrate (LOPRESSOR) 25 MG tablet Take 25 mg by mouth daily.    . pantoprazole (PROTONIX) 40 MG tablet Take 40 mg by mouth daily.     No current facility-administered medications on file prior to visit.         Objective:   Physical Exam Blood pressure (!) 150/86, pulse 72, temperature 98 F (36.7 C), height 5' (1.524 m), weight 206 lb 14.4 oz (93.8 kg), last menstrual period 03/31/2013.  Alert and oriented. Skin warm and dry. Oral mucosa is moist.   . Sclera anicteric, conjunctivae is pink. Thyroid not enlarged. No cervical lymphadenopathy. Lungs clear. Heart regular rate and rhythm.  Abdomen is soft. Bowel sounds are positive. No hepatomegaly. No abdominal masses felt. No tenderness.  No edema to lower  extremities.          Assessment & Plan:  Nausea. Suspect this is related to her GB surgery. Am going to increase her Protonix to BID. OV in 3 months

## 2017-11-22 NOTE — Patient Instructions (Addendum)
OV in 3 months Protonix 40mg  BID

## 2018-02-22 ENCOUNTER — Ambulatory Visit (INDEPENDENT_AMBULATORY_CARE_PROVIDER_SITE_OTHER): Payer: Commercial Managed Care - PPO | Admitting: Internal Medicine

## 2018-02-26 ENCOUNTER — Encounter (INDEPENDENT_AMBULATORY_CARE_PROVIDER_SITE_OTHER): Payer: Self-pay | Admitting: Internal Medicine

## 2018-02-26 ENCOUNTER — Ambulatory Visit (INDEPENDENT_AMBULATORY_CARE_PROVIDER_SITE_OTHER): Payer: Commercial Managed Care - PPO | Admitting: Internal Medicine

## 2019-02-01 IMAGING — NM NM HEPATO W/GB/PHARM/[PERSON_NAME]
2 series · 12 of 12 positions shown · non-contrast
Comparison: None.

CLINICAL DATA: Abdominal pain.

EXAM:
NUCLEAR MEDICINE HEPATOBILIARY IMAGING WITH GALLBLADDER EF
TECHNIQUE: Sequential images of the abdomen were obtained [DATE] minutes
following intravenous administration of radiopharmaceutical. After
oral ingestion of Ensure, gallbladder ejection fraction was
determined. At 60 min, normal ejection fraction is greater than 33%.
RADIOPHARMACEUTICALS:  5.3 mCi 7c-QQm  Choletec IV

[Series 1: biliary · 3.25mm/px · 6 of 60 frames shown]
[frame 6/60]
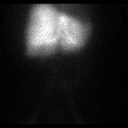
[frame 16/60]
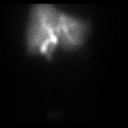
[frame 26/60]
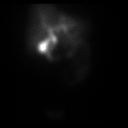
[frame 36/60]
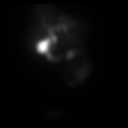
[frame 46/60]
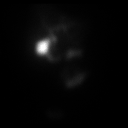
[frame 56/60]
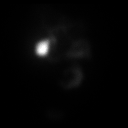

[Series 2: gbef · 3.25mm/px · 6 of 60 frames shown]
[frame 6/60]
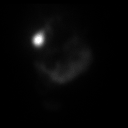
[frame 16/60]
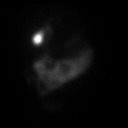
[frame 26/60]
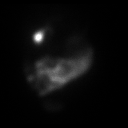
[frame 36/60]
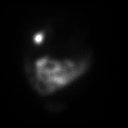
[frame 46/60]
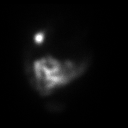
[frame 56/60]
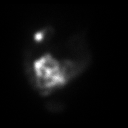

[12 of 12 positions shown; findings below may reference images not displayed]

FINDINGS: There is prompt uptake in the liver. The intrahepatic ducts in the
left lobe are prominent early in the study but washout with normal
excretion into the bowel. Time to excretion into the bowel is
normal. The gallbladder fills normally early in the study.

Calculated gallbladder ejection fraction is 74%. (Normal gallbladder
ejection fraction with Ensure is greater than 33%.)
IMPRESSION: Normal gallbladder ejection fraction.

## 2022-05-11 ENCOUNTER — Encounter: Payer: Self-pay | Admitting: *Deleted

## 2022-05-12 ENCOUNTER — Encounter: Payer: Self-pay | Admitting: Neurology

## 2022-05-12 ENCOUNTER — Ambulatory Visit (INDEPENDENT_AMBULATORY_CARE_PROVIDER_SITE_OTHER): Payer: Commercial Managed Care - PPO | Admitting: Neurology

## 2022-05-12 VITALS — BP 140/80 | HR 70 | Ht 60.0 in | Wt 208.0 lb

## 2022-05-12 DIAGNOSIS — R0683 Snoring: Secondary | ICD-10-CM | POA: Diagnosis not present

## 2022-05-12 DIAGNOSIS — R519 Headache, unspecified: Secondary | ICD-10-CM

## 2022-05-12 DIAGNOSIS — Z9189 Other specified personal risk factors, not elsewhere classified: Secondary | ICD-10-CM | POA: Diagnosis not present

## 2022-05-12 DIAGNOSIS — R002 Palpitations: Secondary | ICD-10-CM

## 2022-05-12 DIAGNOSIS — R000 Tachycardia, unspecified: Secondary | ICD-10-CM

## 2022-05-12 DIAGNOSIS — G4719 Other hypersomnia: Secondary | ICD-10-CM | POA: Diagnosis not present

## 2022-05-12 NOTE — Progress Notes (Signed)
Subjective:    Patient ID: Sabrina Hunter is a 46 y.o. female.  HPI    Star Age, MD, PhD [facility] [address] [place], [place] [address]  Dear Dr. Huel Cote,  I saw your patient, Sabrina Hunter, upon your kind request in my sleep clinic today for initial consultation of her sleep disorder, in particular, concern for underlying obstructive sleep apnea.  The patient is unaccompanied today.  As you know, Sabrina Hunter is a 46 year old right-handed with an underlying medical history of reflux disease, hypertension, migraine headaches, palpitations, anxiety, depression, and severe obesity with a BMI of over 40, who reports snoring and excessive daytime somnolence as well as nighttime palpitations and tachycardia.  I reviewed your office note from 04/05/2022.  Her Epworth sleepiness score is 9/24, fatigue severity score is 33/63.  She wore a recent heart monitor which showed some nocturnal tachycardia, no A-fib, no significant PVCs or PACs.  She occasionally wakes up with a headache, dull, achy, not like her migraines which she used to have.  She has no family history of sleep apnea.  She generally goes to bed around 9 or 10 PM and rise time is around 6:30 AM.  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  She denies night to night nocturia.  She lives with her family including husband and 2 children ages 41 and 88.  They have 1 dog in the household, the dog sleeps in a kennel.  She has a TV on at night but turns of on a timer.  Her weight has been more or less stable.  She does not drink caffeine daily, occasional tea.  She does not currently drink any alcohol and she is a non-smoker.  She has a 25-minute commute.  Her Past Medical History Is Significant For: Past Medical History:  Diagnosis Date   Anxiety and Depression    Continuous urine leakage    Family history of anesthesia complication    pt.'s father was extremely  difficult to wake after anesthesia-emergency situation per pt.   GERD (gastroesophageal reflux disease)    Hernia    hiatal hernia   Hiatal hernia    Hypertension    Migraines    takes Topamax prn   Migraines    SVD (spontaneous vaginal delivery)    x 2    Her Past Surgical History Is Significant For: Past Surgical History:  Procedure Laterality Date   ANTERIOR AND POSTERIOR REPAIR N/A 04/02/2013   Procedure: ANTERIOR (CYSTOCELE) AND POSTERIOR REPAIR (RECTOCELE);  Surgeon: Alwyn Pea, MD;  Location: [facility];  Service: Gynecology;  Laterality: N/A;   BALLOON DILATION  05/17/2012   Procedure: BALLOON DILATION;  Surgeon: Rogene Houston, MD;  [facility];  Service: Endoscopy;  Laterality: N/A;   BLADDER SUSPENSION N/A 04/02/2013   Procedure: TRANSVAGINAL TAPE (TVT) PROCEDURE;  Surgeon: Delice Lesch, MD;  Location: [facility];  Service: Gynecology;  Laterality: N/A;   COLONOSCOPY  2013   MALONEY DILATION  05/17/2012   Procedure: MALONEY DILATION;  Surgeon: Rogene Houston, MD;  [facility];  Service: Endoscopy;  Laterality: N/A;   SAVORY DILATION  05/17/2012   Procedure: SAVORY DILATION;  Surgeon: Rogene Houston, MD;  [facility];  Service: Endoscopy;  Laterality: N/A;   TUBAL LIGATION     2007   VAGINAL HYSTERECTOMY N/A 04/02/2013   Procedure: HYSTERECTOMY VAGINAL;  Surgeon: Alwyn Pea, MD;  Location: [facility];  Service: Gynecology;  Laterality: N/A;   WISDOM TOOTH EXTRACTION      Her Family History Is Significant For: Family History  Problem Relation Age of Onset   Hypertension Mother    Asthma Father    Diverticulitis Father    Ulcers Father    Thrombophlebitis Father    Sleep apnea Neg Hx     Her Social History Is Significant For: Social History   Socioeconomic History   Marital status: Married    Spouse name: Not on file   Number of children: Not on file   Years of education: Not on file   Highest education level: Not on file   Occupational History   Not on file  Tobacco Use   Smoking status: Never   Smokeless tobacco: Never  Substance and Sexual Activity   Alcohol use: No   Drug use: No   Sexual activity: Yes    Birth control/protection: Surgical    Comment: BTL  Other Topics Concern   Not on file  Social History Narrative   Not on file   Social Determinants of Health   Financial Resource Strain: Not on file  Food Insecurity: Not on file  Transportation Needs: Not on file  Physical Activity: Not on file  Stress: Not on file  Social Connections: Not on file    Her Allergies Are:  No Known Allergies:   Her Current Medications Are:  Outpatient Encounter Medications as of 05/12/2022  Medication Sig   clonazePAM (KLONOPIN) 0.5 MG tablet Take 0.5 mg by mouth daily as needed.   FLUoxetine (PROZAC) 20 MG capsule Take 1 capsule by mouth daily.   gabapentin (NEURONTIN) 300 MG capsule Take 300 mg by mouth 2 (two) times daily.   metoprolol tartrate (LOPRESSOR) 50 MG tablet Take 50 mg by mouth daily.   montelukast (SINGULAIR) 10 MG tablet Take 10 mg by mouth at bedtime.   pantoprazole (PROTONIX) 40 MG tablet Take 40 mg by mouth daily.   No facility-administered encounter medications on file as of 05/12/2022.  :   Review of Systems:  Out of a complete 14 point review of systems, all are reviewed and negative with the exception of these symptoms as listed below:  Review of Systems  Neurological:        Pt here for sleep consult  Pt snores,headaches,fatigue ,hypertension . Pt denies sleep study,CPAP machine     ESS:9 FSS:33    Objective:  Neurological Exam  Physical Exam Physical Examination:   Vitals:   05/12/22 0857  BP: (!) 140/80  Pulse: 70    General Examination: The patient is a very pleasant 46 y.o. female in no acute distress. She appears well-developed and well-nourished and well groomed.   HEENT: Normocephalic, atraumatic, pupils are equal, round and reactive to light,  extraocular tracking is good without limitation to gaze excursion or nystagmus noted. Hearing is grossly intact. Face is symmetric with normal facial animation. Speech is clear with no dysarthria noted. There is no hypophonia. There is no lip, neck/head, jaw or voice tremor. Neck is supple with full range of passive and active motion. There are no carotid bruits on auscultation. Oropharynx exam reveals: No significant mouth dryness, good dental hygiene and mild airway crowding secondary to small airway entry.  Mallampati class II, tonsils on the smaller side.  Neck circumference 14-7/8 inches.  Mild to moderate overbite.  Tongue protrudes centrally and palate elevates symmetrically.    Chest: Clear to auscultation without wheezing,  rhonchi or crackles noted.  Heart: S1+S2+0, regular and normal without murmurs, rubs or gallops noted.   Abdomen: Soft, non-tender and non-distended.  Extremities: There is no pitting edema in the distal lower extremities bilaterally.   Skin: Warm and dry without trophic changes noted.   Musculoskeletal: exam reveals no obvious joint deformities, tenderness or joint swelling or erythema.   Neurologically:  Mental status: The patient is awake, alert and oriented in all 4 spheres. Her immediate and remote memory, attention, language skills and fund of knowledge are appropriate. There is no evidence of aphasia, agnosia, apraxia or anomia. Speech is clear with normal prosody and enunciation. Thought process is linear. Mood is normal and affect is normal.  Cranial nerves II - XII are as described above under HEENT exam.  Motor exam: Normal bulk, strength and tone is noted. There is no obvious tremor. Fine motor skills and coordination: grossly intact.  Cerebellar testing: No dysmetria or intention tremor. There is no truncal or gait ataxia.  Sensory exam: intact to light touch in the upper and lower extremities.  Gait, station and balance: She stands easily. No veering to  one side is noted. No leaning to one side is noted. Posture is age-appropriate and stance is narrow based. Gait shows normal stride length and normal pace. No problems turning are noted.  Assessment and Plan:  In summary, Sabrina Hunter is a very pleasant 46 y.o.-year old female with an underlying medical history of reflux disease, hypertension, migraine headaches, palpitations, anxiety, depression, and severe obesity with a BMI of over 40, whose history and physical exam are concerning for sleep disordered breathing, supporting a current working diagnosis of unspecified sleep apnea, with the main differential diagnoses of obstructive sleep apnea (OSA) versus upper airway resistance syndrome (UARS) versus central sleep apnea (CSA), or mixed sleep apnea. A laboratory attended sleep study is considered gold standard for evaluation of sleep disordered breathing and is recommended at this time and clinically justified.   I had a long chat with the patient about my findings and the diagnosis of sleep apnea, particularly OSA, its prognosis and treatment options. We talked about medical/conservative treatments, surgical interventions and non-pharmacological approaches for symptom control. I explained, in particular, the risks and ramifications of untreated moderate to severe OSA, especially with respect to developing cardiovascular disease down the road, including congestive heart failure (CHF), difficult to treat hypertension, cardiac arrhythmias (particularly A-fib), neurovascular complications including TIA, stroke and dementia. Even type 2 diabetes has, in part, been linked to untreated OSA. Symptoms of untreated OSA may include (but may not be limited to) daytime sleepiness, nocturia (i.e. frequent nighttime urination), memory problems, mood irritability and suboptimally controlled or worsening mood disorder such as depression and/or anxiety, lack of energy, lack of motivation, physical discomfort, as well as  recurrent headaches, especially morning or nocturnal headaches. We talked about the importance of maintaining a healthy lifestyle and striving for healthy weight. In addition, we talked about the importance of striving for and maintaining good sleep hygiene. I recommended the following at this time: sleep study.  I outlined the differences between a laboratory attended sleep study which is considered more comprehensive and accurate over the option of a home sleep test (HST); the latter may lead to underestimation of sleep disordered breathing in some instances and does not help with diagnosing upper airway resistance syndrome and is not accurate enough to diagnose primary central sleep apnea typically. I explained the different sleep test procedures to the patient in detail  and also outlined possible surgical and non-surgical treatment options of OSA, including the use of a pressure airway pressure (PAP) device (ie CPAP, AutoPAP/APAP or BiPAP in certain circumstances), a custom-made dental device (aka oral appliance, which would require a referral to a specialist dentist or orthodontist typically, and is generally speaking not considered a good choice for patients with full dentures or edentulous state), upper airway surgical options, such as traditional UPPP (which is not considered a first-line treatment) or the Inspire device (hypoglossal nerve stimulator, which would involve a referral for consultation with an ENT surgeon, after careful selection, following inclusion criteria). I explained the PAP treatment option to the patient in detail, as this is generally considered first-line treatment.  The patient indicated that she would be willing to try PAP therapy, if the need arises. I explained the importance of being compliant with PAP treatment, not only for insurance purposes but primarily to improve patient's symptoms symptoms, and for the patient's long term health benefit, including to reduce Her  cardiovascular risks longer-term.    We will pick up our discussion about the next steps and treatment options after testing.  We will keep her posted as to the test results by phone call and/or MyChart messaging where possible.  We will plan to follow-up in sleep clinic accordingly as well.  I answered all her questions today and the patient was in agreement.   I encouraged her to call with any interim questions, concerns, problems or updates or email [place] through [place].  Generally speaking, sleep test authorizations may take up to 2 weeks, sometimes less, sometimes longer, the patient is encouraged to get in touch with [place] if they do not hear back from the sleep lab staff directly within the next 2 weeks.  Thank you very much for allowing me to participate in the care of this nice patient. If I can be of any further assistance to you please do not hesitate to call me at [phone].  Sincerely,   Star Age, MD, PhD

## 2022-05-12 NOTE — Patient Instructions (Signed)

## 2022-07-04 ENCOUNTER — Telehealth: Payer: Self-pay

## 2022-07-04 NOTE — Telephone Encounter (Signed)
LVM for pt to call back to schedule sleep study.  

## 2022-07-05 ENCOUNTER — Telehealth: Payer: Self-pay | Admitting: Neurology

## 2022-07-05 NOTE — Telephone Encounter (Signed)
LVM for pt to call back to schedule   \[facility] no auth req spoke to [place] ref # [id number]

## 2022-07-07 NOTE — Telephone Encounter (Signed)
Patient returned my call.  NPS[facility]- [facility] no Josem Kaufmann req spoke to [place] ref # [id number]   She is scheduled at [facility] for 08/15/22 at 8 pm.  Mailed packet to the patient.

## 2022-08-15 ENCOUNTER — Ambulatory Visit (INDEPENDENT_AMBULATORY_CARE_PROVIDER_SITE_OTHER): Payer: Commercial Managed Care - PPO | Admitting: Neurology

## 2022-08-15 DIAGNOSIS — R002 Palpitations: Secondary | ICD-10-CM

## 2022-08-15 DIAGNOSIS — R0683 Snoring: Secondary | ICD-10-CM | POA: Diagnosis not present

## 2022-08-15 DIAGNOSIS — G4720 Circadian rhythm sleep disorder, unspecified type: Secondary | ICD-10-CM

## 2022-08-15 DIAGNOSIS — Z9189 Other specified personal risk factors, not elsewhere classified: Secondary | ICD-10-CM

## 2022-08-15 DIAGNOSIS — R519 Headache, unspecified: Secondary | ICD-10-CM

## 2022-08-15 DIAGNOSIS — G4719 Other hypersomnia: Secondary | ICD-10-CM

## 2022-08-15 DIAGNOSIS — R000 Tachycardia, unspecified: Secondary | ICD-10-CM

## 2022-08-15 DIAGNOSIS — G4733 Obstructive sleep apnea (adult) (pediatric): Secondary | ICD-10-CM

## 2022-08-22 NOTE — Procedures (Unsigned)
Physician Interpretation:   46 year old right-handed with an underlying medical history of reflux disease, hypertension, migraine headaches, palpitations, anxiety, depression, and severe obesity with a BMI of over 40, who reports snoring and excessive daytime somnolence as well as nighttime palpitations and tachycardia.  I reviewed your office note from 04/05/2022.  Her Epworth sleepiness score is 9/24, fatigue severity score is 33/63.   EEG: Review of the EEG showed no abnormal electrical discharges and symmetrical bihemispheric findings.    EKG: The EKG revealed normal sinus rhythm (NSR).   AUDIO/VIDEO REVIEW: The audio and video review did not show any abnormal or unusual behaviors, movements, phonations or vocalizations. The patient took no bathroom breaks. Snoring was in the mild to moderate range.  POST-STUDY QUESTIONNAIRE: Post study, the patient indicated, that sleep was the same as usual.   IMPRESSION:    Mild Obstructive sleep apnea (OSA) Dysfunctions associated with sleep stages or arousal from sleep  RECOMMENDATIONS:   This study demonstrates overall mild obstructive sleep apnea, severe in REM sleep with a total AHI of 11.6/hour, REM AHI of 40.7/hour, supine AHI of 11.8/hour and O2 nadir of 81%. Given the patient's medical history and sleep related complaints, treatment with positive airway pressure is recommended; this can be achieved in the form of autoPAP. Alternatively, a full-night CPAP titration study would allow optimization of therapy if needed. Other treatment options may include avoidance of supine sleep position along with weight loss, or the use of an oral appliance in selected patients. Please note, that untreated obstructive sleep apnea may carry additional perioperative morbidity. Patients with significant obstructive sleep apnea should receive perioperative PAP therapy and the surgeons and particularly the anesthesiologist should be informed of the diagnosis and the  severity of the sleep disordered breathing. Severe PLMs (periodic limb movements of sleep) were noted during this study with mild arousals; clinical correlation is recommended. The patient has a history of restless legs syndrome and takes ropinirole. Medication effect from the antidepressant medication should be considered.  This study shows significant sleep fragmentation and abnormal sleep stage percentages; these are nonspecific findings and per se do not signify an intrinsic sleep disorder or a cause for the patient's sleep-related symptoms. Causes include (but are not limited to) the first night effect of the sleep study, circadian rhythm disturbances, medication effect or an underlying mood disorder or medical problem.  The patient should be cautioned not to drive, work at heights, or operate dangerous or heavy equipment when tired or sleepy. Review and reiteration of good sleep hygiene measures should be pursued with any patient. The patient will be seen in follow-up by Dr. Rexene Alberts at [facility] for discussion of the test results and further management strategies. The referring provider will be notified of the test results.  I certify that I have reviewed the entire raw data recording prior to the issuance of this report in accordance with the Standards of Accreditation of the American Academy of Sleep Medicine (AASM).  Star Age, MD, PhD Medical Director [facility]) [place], ABPN (Neurology and Sleep)   Technical Report:   ***

## 2022-08-24 NOTE — Addendum Note (Signed)
Addended by: Star Age on: 08/24/2022 07:04 AM   Modules accepted: Orders

## 2022-08-30 ENCOUNTER — Telehealth: Payer: Self-pay

## 2022-08-30 NOTE — Telephone Encounter (Signed)
I called patient to discuss. No answer, left a message asking her to call me back. If patient calls back another day please route to POD 4.

## 2022-08-30 NOTE — Telephone Encounter (Signed)
Patient returned my call. I advised pt that Dr. Rexene Alberts reviewed their sleep study results and found that pt has mild osa. Dr. Rexene Alberts recommends that pt start an auto PAP at home. I reviewed PAP compliance expectations with the pt. Pt is agreeable to starting an auto-PAP. I advised pt that an order will be sent to a DME, [facility], and [facility] will call the pt within about one week after they file with the pt's insurance. [facility] will show the pt how to use the machine, fit for masks, and troubleshoot the auto-PAP if needed. A follow up appt was made for insurance purposes on 10/24/22 at 7:45am with Dr. Rexene Alberts. Pt verbalized understanding to arrive 15 minutes early and bring their auto-PAP.  Pt verbalized understanding of results. Pt had no questions at this time but was encouraged to call back if questions arise. I have sent the order to [facility] and have received confirmation that they have received the order.

## 2022-08-30 NOTE — Telephone Encounter (Signed)
-----   Message from Star Age, MD sent at 08/24/2022  7:04 AM EST ----- Patient referred by Dr. Huel Cote, seen by me on 05/12/22, diagnostic PSG on 08/15/22.    Please call and notify the patient that the recent sleep study did confirm the diagnosis of obstructive sleep apnea. OSA is overall mild, but worth treating to see if she feels better after treatment. To that end I recommend treatment for this in the form of autoPAP, which means, that we don't have to bring her back for a second sleep study with CPAP, but will let him try an autoPAP machine at home, through a DME company (of her choice, or as per insurance requirement). The DME representative will educate her on how to use the machine, how to put the mask on, etc. I have placed an order in the chart. Please send referral, talk to patient, send report to referring MD. We will need a FU in sleep clinic for 10 weeks post-PAP set up, please arrange that with me or one of our NPs. Thanks,   Star Age, MD, PhD [facility])

## 2022-10-17 ENCOUNTER — Encounter: Payer: Self-pay | Admitting: Neurology

## 2022-10-18 NOTE — Telephone Encounter (Signed)
Spoke to pt and she will upload cards [employer] is now her insurance carrier.  Then will fax records for her PAP therapy.

## 2022-10-24 ENCOUNTER — Ambulatory Visit: Payer: Self-pay | Admitting: Neurology

## 2022-10-25 NOTE — Telephone Encounter (Signed)
Spoke with patient haven't heard from [facility]  for CPAP set . Sandy,RN faxed orders Jan 30th 2024 . Gave patient [facility] #  Pt thanked me for calling

## 2022-12-22 NOTE — Telephone Encounter (Signed)
I received faxed from [employer] about them trying to call pt.  I mailed this to pt. She is to call if she is still planning on getting machine.  (There had been a death in her family).
